# Patient Record
Sex: Female | Born: 1945 | Race: White | Hispanic: No | Smoking: Heavy tobacco smoker
Health system: Southern US, Community
[De-identification: ages and names within clinical notes are randomized; demographics above are authoritative.]

## PROBLEM LIST (undated history)

## (undated) DIAGNOSIS — J45909 Unspecified asthma, uncomplicated: Secondary | ICD-10-CM

## (undated) DIAGNOSIS — Z972 Presence of dental prosthetic device (complete) (partial): Secondary | ICD-10-CM

## (undated) DIAGNOSIS — I219 Acute myocardial infarction, unspecified: Secondary | ICD-10-CM

## (undated) DIAGNOSIS — I10 Essential (primary) hypertension: Secondary | ICD-10-CM

## (undated) DIAGNOSIS — M5020 Other cervical disc displacement, unspecified cervical region: Secondary | ICD-10-CM

## (undated) DIAGNOSIS — J449 Chronic obstructive pulmonary disease, unspecified: Secondary | ICD-10-CM

## (undated) DIAGNOSIS — M5126 Other intervertebral disc displacement, lumbar region: Secondary | ICD-10-CM

## (undated) DIAGNOSIS — Z973 Presence of spectacles and contact lenses: Secondary | ICD-10-CM

## (undated) HISTORY — PX: SQUAMOUS CELL CARCINOMA EXCISION: SHX2433

## (undated) HISTORY — PX: DILATION AND CURETTAGE OF UTERUS: SHX78

## (undated) HISTORY — PX: OOPHORECTOMY: SHX86

---

## 1977-06-11 HISTORY — PX: ABDOMINAL HYSTERECTOMY: SHX81

## 1999-04-19 ENCOUNTER — Other Ambulatory Visit: Admission: RE | Admit: 1999-04-19 | Discharge: 1999-04-19 | Payer: Self-pay | Admitting: Obstetrics and Gynecology

## 2000-08-02 ENCOUNTER — Other Ambulatory Visit: Admission: RE | Admit: 2000-08-02 | Discharge: 2000-08-02 | Payer: Self-pay | Admitting: Obstetrics and Gynecology

## 2000-10-03 ENCOUNTER — Ambulatory Visit (HOSPITAL_BASED_OUTPATIENT_CLINIC_OR_DEPARTMENT_OTHER): Admission: RE | Admit: 2000-10-03 | Discharge: 2000-10-03 | Payer: Self-pay | Admitting: Plastic Surgery

## 2001-08-12 ENCOUNTER — Other Ambulatory Visit: Admission: RE | Admit: 2001-08-12 | Discharge: 2001-08-12 | Payer: Self-pay | Admitting: Obstetrics and Gynecology

## 2002-10-15 ENCOUNTER — Other Ambulatory Visit: Admission: RE | Admit: 2002-10-15 | Discharge: 2002-10-15 | Payer: Self-pay | Admitting: Obstetrics and Gynecology

## 2004-01-27 ENCOUNTER — Other Ambulatory Visit: Admission: RE | Admit: 2004-01-27 | Discharge: 2004-01-27 | Payer: Self-pay | Admitting: Obstetrics and Gynecology

## 2005-06-19 ENCOUNTER — Other Ambulatory Visit: Admission: RE | Admit: 2005-06-19 | Discharge: 2005-06-19 | Payer: Self-pay | Admitting: Obstetrics and Gynecology

## 2005-07-15 ENCOUNTER — Emergency Department: Payer: Self-pay | Admitting: Emergency Medicine

## 2005-07-25 ENCOUNTER — Emergency Department: Payer: Self-pay | Admitting: Emergency Medicine

## 2012-01-30 ENCOUNTER — Emergency Department: Payer: Self-pay | Admitting: Emergency Medicine

## 2012-01-30 LAB — CBC
HCT: 44.5 % (ref 35.0–47.0)
HGB: 15 g/dL (ref 12.0–16.0)
MCH: 30.8 pg (ref 26.0–34.0)
MCHC: 33.8 g/dL (ref 32.0–36.0)
RDW: 13.4 % (ref 11.5–14.5)
WBC: 12.6 10*3/uL — ABNORMAL HIGH (ref 3.6–11.0)

## 2012-01-30 LAB — BASIC METABOLIC PANEL
Calcium, Total: 9.4 mg/dL (ref 8.5–10.1)
Chloride: 102 mmol/L (ref 98–107)
Co2: 29 mmol/L (ref 21–32)
EGFR (Non-African Amer.): >60
Glucose: 104 mg/dL — ABNORMAL HIGH (ref 65–99)
Osmolality: 275 (ref 275–301)
Potassium: 3.8 mmol/L (ref 3.5–5.1)
Sodium: 137 mmol/L (ref 136–145)

## 2012-01-30 LAB — CK TOTAL AND CKMB (NOT AT ARMC): CK-MB: 3 ng/mL (ref 0.5–3.6)

## 2015-06-27 ENCOUNTER — Encounter: Payer: Self-pay | Admitting: *Deleted

## 2015-06-28 NOTE — Discharge Instructions (Signed)

## 2015-07-01 ENCOUNTER — Encounter
Admission: RE | Disposition: A | Discharge status: Home / Self Care | Payer: Self-pay | Source: Ambulatory Visit | Attending: Unknown Physician Specialty

## 2015-07-01 ENCOUNTER — Ambulatory Visit: Payer: Medicare Other | Admitting: Anesthesiology

## 2015-07-01 ENCOUNTER — Ambulatory Visit
Admission: RE | Admit: 2015-07-01 | Discharge: 2015-07-01 | Disposition: A | Discharge status: Home / Self Care | Payer: Medicare Other | Source: Ambulatory Visit | Attending: Unknown Physician Specialty | Admitting: Unknown Physician Specialty

## 2015-07-01 DIAGNOSIS — Z7951 Long term (current) use of inhaled steroids: Secondary | ICD-10-CM | POA: Diagnosis not present

## 2015-07-01 DIAGNOSIS — J309 Allergic rhinitis, unspecified: Secondary | ICD-10-CM | POA: Insufficient documentation

## 2015-07-01 DIAGNOSIS — Z8541 Personal history of malignant neoplasm of cervix uteri: Secondary | ICD-10-CM | POA: Diagnosis not present

## 2015-07-01 DIAGNOSIS — Z881 Allergy status to other antibiotic agents status: Secondary | ICD-10-CM | POA: Insufficient documentation

## 2015-07-01 DIAGNOSIS — H6123 Impacted cerumen, bilateral: Secondary | ICD-10-CM | POA: Insufficient documentation

## 2015-07-01 DIAGNOSIS — Z8543 Personal history of malignant neoplasm of ovary: Secondary | ICD-10-CM | POA: Insufficient documentation

## 2015-07-01 DIAGNOSIS — M1991 Primary osteoarthritis, unspecified site: Secondary | ICD-10-CM | POA: Diagnosis not present

## 2015-07-01 DIAGNOSIS — F17200 Nicotine dependence, unspecified, uncomplicated: Secondary | ICD-10-CM | POA: Diagnosis not present

## 2015-07-01 DIAGNOSIS — J45909 Unspecified asthma, uncomplicated: Secondary | ICD-10-CM | POA: Insufficient documentation

## 2015-07-01 DIAGNOSIS — Z90710 Acquired absence of both cervix and uterus: Secondary | ICD-10-CM | POA: Diagnosis not present

## 2015-07-01 DIAGNOSIS — Z79899 Other long term (current) drug therapy: Secondary | ICD-10-CM | POA: Diagnosis not present

## 2015-07-01 DIAGNOSIS — H7193 Unspecified cholesteatoma, bilateral: Secondary | ICD-10-CM | POA: Diagnosis present

## 2015-07-01 HISTORY — DX: Other intervertebral disc displacement, lumbar region: M51.26

## 2015-07-01 HISTORY — DX: Essential (primary) hypertension: I10

## 2015-07-01 HISTORY — DX: Other cervical disc displacement, unspecified cervical region: M50.20

## 2015-07-01 HISTORY — DX: Acute myocardial infarction, unspecified: I21.9

## 2015-07-01 HISTORY — DX: Chronic obstructive pulmonary disease, unspecified: J44.9

## 2015-07-01 HISTORY — DX: Presence of spectacles and contact lenses: Z97.3

## 2015-07-01 HISTORY — DX: Presence of dental prosthetic device (complete) (partial): Z97.2

## 2015-07-01 HISTORY — DX: Unspecified asthma, uncomplicated: J45.909

## 2015-07-01 SURGERY — EXAM UNDER ANESTHESIA
Anesthesia: General | Laterality: Bilateral | Wound class: Clean Contaminated

## 2015-07-01 MED ORDER — LACTATED RINGERS IV SOLN
INTRAVENOUS | Status: DC
  Administered 2015-07-01: 11:00:00 via INTRAVENOUS

## 2015-07-01 MED ORDER — ACETAMINOPHEN 325 MG PO TABS
650.0000 mg | ORAL_TABLET | Freq: Once | ORAL | Status: AC
  Administered 2015-07-01: 650 mg via ORAL

## 2015-07-01 MED ORDER — ONDANSETRON HCL 4 MG/2ML IJ SOLN
INTRAMUSCULAR | Status: DC | PRN
  Administered 2015-07-01: 4 mg via INTRAVENOUS

## 2015-07-01 MED ORDER — PROPOFOL 10 MG/ML IV BOLUS
INTRAVENOUS | Status: DC | PRN
  Administered 2015-07-01: 50 mg via INTRAVENOUS

## 2015-07-01 MED ORDER — OFLOXACIN 0.3 % OP SOLN
OPHTHALMIC | Status: DC | PRN
  Administered 2015-07-01: 4 [drp] via OTIC

## 2015-07-01 MED ORDER — LIDOCAINE HCL (CARDIAC) 20 MG/ML IV SOLN
INTRAVENOUS | Status: DC | PRN
  Administered 2015-07-01: 50 mg via INTRAVENOUS

## 2015-07-01 MED ORDER — GLYCOPYRROLATE 0.2 MG/ML IJ SOLN
INTRAMUSCULAR | Status: DC | PRN
  Administered 2015-07-01: 0.1 mg via INTRAVENOUS

## 2015-07-01 MED ORDER — ACETAMINOPHEN 10 MG/ML IV SOLN: 1000.0000 mg | Freq: Once | INTRAVENOUS | Status: DC

## 2015-07-01 MED ORDER — FENTANYL CITRATE (PF) 100 MCG/2ML IJ SOLN: 25.0000 ug | INTRAMUSCULAR | Status: DC | PRN

## 2015-07-01 MED ORDER — ONDANSETRON HCL 4 MG/2ML IJ SOLN: 4.0000 mg | Freq: Once | INTRAMUSCULAR | Status: DC | PRN

## 2015-07-01 SURGICAL SUPPLY — 10 items
BALL CTTN LRG ABS STRL LF (GAUZE/BANDAGES/DRESSINGS) ×1
BLADE MYR LANCE NRW W/HDL (BLADE) IMPLANT
CANISTER SUCT 1200ML W/VALVE (MISCELLANEOUS) ×2 IMPLANT
COTTONBALL LRG STERILE PKG (GAUZE/BANDAGES/DRESSINGS) ×2 IMPLANT
GLOVE BIO SURGEON STRL SZ7.5 (GLOVE) ×5 IMPLANT
KIT ROOM TURNOVER OR (KITS) ×1 IMPLANT
STRAP BODY AND KNEE 60X3 (MISCELLANEOUS) ×3 IMPLANT
TOWEL OR 17X26 4PK STRL BLUE (TOWEL DISPOSABLE) ×3 IMPLANT
TUBING CONN 6MMX3.1M (TUBING) ×2
TUBING SUCTION CONN 0.25 STRL (TUBING) IMPLANT

## 2015-07-01 NOTE — Anesthesia Preprocedure Evaluation (Signed)
Anesthesia Evaluation  Patient identified by MRN, date of birth, ID band  Reviewed: Allergy & Precautions, H&P , NPO status , Patient's Chart, lab work & pertinent test results  Airway Mallampati: II  TM Distance: >3 FB Neck ROM: full    Dental  (+) Upper Dentures   Pulmonary asthma , COPD,  COPD inhaler, Current Smoker,    Pulmonary exam normal        Cardiovascular hypertension,  Rhythm:regular Rate:Normal     Neuro/Psych    GI/Hepatic   Endo/Other    Renal/GU      Musculoskeletal   Abdominal   Peds  Hematology   Anesthesia Other Findings   Reproductive/Obstetrics                             Anesthesia Physical Anesthesia Plan  ASA: III  Anesthesia Plan: General   Post-op Pain Management:    Induction:   Airway Management Planned:   Additional Equipment:   Intra-op Plan:   Post-operative Plan:   Informed Consent: I have reviewed the patients History and Physical, chart, labs and discussed the procedure including the risks, benefits and alternatives for the proposed anesthesia with the patient or authorized representative who has indicated his/her understanding and acceptance.     Plan Discussed with: CRNA  Anesthesia Plan Comments:         Anesthesia Quick Evaluation

## 2015-07-01 NOTE — Op Note (Signed)
07/01/2015  1:12 PM    [name], Gabriela Dice  [id number]   Pre-Op Dx: CERUMEN OTIITS EXTERMA  Post-op Dx: SAME  Proc: Exam under anesthesia with removal of cerumen and cholesteatoma right and left ear   Surg:  Linus Salmons [name]  Anes:  GOT  EBL:  Less than 5 cc  Comp:  None  Findings:  Large anterior perforation on the right with cholesteatoma filling the anterior middle ear space small central perforation posteriorly on the left with the cholesteatoma and wax debris  Procedure: Gabriela [name] was identified in the holding area taken the operating room placed in supine position. After IV sedation on the operating microscope was brought into the field beginning on the right-hand side the ear was examined. A large anterior piece of cerumen was removed need this was a large cholesteatoma ball filling the middle ear space anteriorly this was removed in its entirety in one piece. Examination the eardrum showed a large anterior perforation from whence the cholesteatoma came. Floxin drops were instilled in the actual canal followed by cotton ball. The left ear was examined there was a small posterior central perforation which was also filled with cerumen and debris this was gently teased free however the remaining some debris within the middle ear space. I did not feel comfortable probing this area the middle ear space for fear of endangering the ossicles. Floxin drops were then used on the left-hand side followed by cotton ball was return anesthesia where she was awakened in the operating room and taken recovery room in stable condition.  Cultures: None Specimens: Right ear cholesteatoma   Dispo:   Good  Plan:  Follow-up 3 weeks for hearing test and possible CT scan  Gabriela [name]  07/01/2015 1:12 PM

## 2015-07-01 NOTE — Anesthesia Procedure Notes (Addendum)
Performed by: Andee Poles Pre-anesthesia Checklist: Patient identified, Emergency Drugs available, Suction available, Timeout performed and Patient being monitored Patient Re-evaluated:Patient Re-evaluated prior to inductionOxygen Delivery Method: Circle system utilized Preoxygenation: Pre-oxygenation with 100% oxygen Intubation Type: IV induction Ventilation: Mask ventilation without difficulty and Mask ventilation throughout procedure Dental Injury: Teeth and Oropharynx as per pre-operative assessment

## 2015-07-01 NOTE — Transfer of Care (Signed)
Immediate Anesthesia Transfer of Care Note  Patient: Gabriela [name]  Procedure(s) Performed: Procedure(s): EXAM UNDER ANESTHESIA WITH CERUMEN REMOVAL (Bilateral)  Patient Location: PACU  Anesthesia Type: General  Level of Consciousness: awake, alert  and patient cooperative  Airway and Oxygen Therapy: Patient Spontanous Breathing and Patient connected to supplemental oxygen  Post-op Assessment: Post-op Vital signs reviewed, Patient's Cardiovascular Status Stable, Respiratory Function Stable, Patent Airway and No signs of Nausea or vomiting  Post-op Vital Signs: Reviewed and stable  Complications: No apparent anesthesia complications

## 2015-07-01 NOTE — H&P (Signed)
  H+P  Reviewed and will be scanned in later. No changes noted. 

## 2015-07-01 NOTE — Anesthesia Postprocedure Evaluation (Signed)
Anesthesia Post Note  Patient: Gabriela [name]  Procedure(s) Performed: Procedure(s) (LRB): EXAM UNDER ANESTHESIA WITH CERUMEN REMOVAL LEFT EAR AND REMOVAL OF CHOLESTEATOMA RIGHT EAR (Bilateral)  Patient location during evaluation: PACU Anesthesia Type: General Level of consciousness: awake and alert and oriented Pain management: satisfactory to patient Vital Signs Assessment: post-procedure vital signs reviewed and stable Respiratory status: spontaneous breathing, nonlabored ventilation and respiratory function stable Cardiovascular status: blood pressure returned to baseline and stable Postop Assessment: Adequate PO intake and No signs of nausea or vomiting Anesthetic complications: no    Cherly [place]

## 2015-07-05 LAB — SURGICAL PATHOLOGY

## 2015-07-21 ENCOUNTER — Other Ambulatory Visit: Payer: Self-pay | Admitting: Unknown Physician Specialty

## 2015-07-21 DIAGNOSIS — H7192 Unspecified cholesteatoma, left ear: Secondary | ICD-10-CM

## 2015-07-28 ENCOUNTER — Ambulatory Visit
Admission: RE | Admit: 2015-07-28 | Discharge: 2015-07-28 | Disposition: A | Discharge status: Home / Self Care | Payer: Medicare Other | Source: Ambulatory Visit | Attending: Unknown Physician Specialty | Admitting: Unknown Physician Specialty

## 2015-07-28 DIAGNOSIS — H7192 Unspecified cholesteatoma, left ear: Secondary | ICD-10-CM | POA: Diagnosis present

## 2016-03-09 ENCOUNTER — Emergency Department: Payer: Medicare Other

## 2016-03-09 ENCOUNTER — Encounter: Payer: Self-pay | Admitting: Medical Oncology

## 2016-03-09 ENCOUNTER — Other Ambulatory Visit: Payer: Self-pay

## 2016-03-09 ENCOUNTER — Emergency Department
Admission: EM | Admit: 2016-03-09 | Discharge: 2016-03-09 | Disposition: A | Discharge status: Home / Self Care | Payer: Medicare Other | Attending: Emergency Medicine | Admitting: Emergency Medicine

## 2016-03-09 DIAGNOSIS — I159 Secondary hypertension, unspecified: Secondary | ICD-10-CM | POA: Insufficient documentation

## 2016-03-09 DIAGNOSIS — I10 Essential (primary) hypertension: Secondary | ICD-10-CM | POA: Diagnosis not present

## 2016-03-09 DIAGNOSIS — I252 Old myocardial infarction: Secondary | ICD-10-CM | POA: Diagnosis not present

## 2016-03-09 DIAGNOSIS — F17210 Nicotine dependence, cigarettes, uncomplicated: Secondary | ICD-10-CM | POA: Insufficient documentation

## 2016-03-09 DIAGNOSIS — J45909 Unspecified asthma, uncomplicated: Secondary | ICD-10-CM | POA: Diagnosis not present

## 2016-03-09 DIAGNOSIS — J449 Chronic obstructive pulmonary disease, unspecified: Secondary | ICD-10-CM | POA: Diagnosis not present

## 2016-03-09 DIAGNOSIS — R42 Dizziness and giddiness: Secondary | ICD-10-CM | POA: Diagnosis present

## 2016-03-09 LAB — COMPREHENSIVE METABOLIC PANEL
ALK PHOS: 70 U/L (ref 38–126)
ALT: 12 U/L — AB (ref 14–54)
AST: 20 U/L (ref 15–41)
Albumin: 4.3 g/dL (ref 3.5–5.0)
Anion gap: 9 (ref 5–15)
BUN: 16 mg/dL (ref 6–20)
CALCIUM: 9.2 mg/dL (ref 8.9–10.3)
CO2: 27 mmol/L (ref 22–32)
CREATININE: 0.81 mg/dL (ref 0.44–1.00)
Chloride: 98 mmol/L — ABNORMAL LOW (ref 101–111)
Glucose, Bld: 107 mg/dL — ABNORMAL HIGH (ref 65–99)
Potassium: 4.3 mmol/L (ref 3.5–5.1)
Sodium: 134 mmol/L — ABNORMAL LOW (ref 135–145)
Total Bilirubin: 0.7 mg/dL (ref 0.3–1.2)
Total Protein: 7.3 g/dL (ref 6.5–8.1)

## 2016-03-09 LAB — CBC WITH DIFFERENTIAL/PLATELET
Basophils Absolute: 0.1 10*3/uL (ref 0–0.1)
Basophils Relative: 1 %
Eosinophils Absolute: 0 10*3/uL (ref 0–0.7)
Eosinophils Relative: 0 %
HEMATOCRIT: 42.6 % (ref 35.0–47.0)
HEMOGLOBIN: 14.3 g/dL (ref 12.0–16.0)
LYMPHS ABS: 2.2 10*3/uL (ref 1.0–3.6)
LYMPHS PCT: 15 %
MCH: 30.3 pg (ref 26.0–34.0)
MCHC: 33.7 g/dL (ref 32.0–36.0)
MCV: 89.9 fL (ref 80.0–100.0)
Monocytes Absolute: 0.8 10*3/uL (ref 0.2–0.9)
Monocytes Relative: 6 %
NEUTROS ABS: 11.2 10*3/uL — AB (ref 1.4–6.5)
NEUTROS PCT: 78 %
Platelets: 289 10*3/uL (ref 150–440)
RBC: 4.74 MIL/uL (ref 3.80–5.20)
RDW: 13.3 % (ref 11.5–14.5)
WBC: 14.4 10*3/uL — AB (ref 3.6–11.0)

## 2016-03-09 LAB — TSH: TSH: 1.37 u[IU]/mL (ref 0.350–4.500)

## 2016-03-09 LAB — TROPONIN I

## 2016-03-09 MED ORDER — CLONIDINE HCL 0.1 MG PO TABS
0.1000 mg | ORAL_TABLET | Freq: Once | ORAL | Status: AC
  Administered 2016-03-09: 0.1 mg via ORAL
  Filled 2016-03-09: qty 1

## 2016-03-09 MED ORDER — CLONIDINE HCL 0.1 MG PO TABS: 0.1000 mg | ORAL_TABLET | Freq: Two times a day (BID) | ORAL | 0 refills | Status: DC | PRN

## 2016-03-09 MED ORDER — LORAZEPAM 2 MG/ML IJ SOLN
0.5000 mg | Freq: Once | INTRAMUSCULAR | Status: AC
  Administered 2016-03-09: 0.5 mg via INTRAVENOUS
  Filled 2016-03-09: qty 1

## 2016-03-09 MED ORDER — LORAZEPAM 1 MG PO TABS: 1.0000 mg | ORAL_TABLET | Freq: Two times a day (BID) | ORAL | 0 refills | Status: AC | PRN

## 2016-03-09 NOTE — ED Provider Notes (Signed)
Time Seen: Approximately 1140  I have reviewed the triage notes  Chief Complaint: Weakness; Headache; and Dizziness   History of Present Illness: Gabriela [name] is a 70 y.o. female *who presents with diffuse symptoms of a generalized weakness and occasional feelings of lightheadedness. Patient's been to see her primary physician was recently diagnosed with sinusitis. She went to the primary doctor today and was noted to have tachycardia and was referred to the emergency department for further evaluation. Patient states that she does feel anxious. She states no trouble with speech or swallowing but she has had a right-sided headache that she states initially started in the frontal area and then migrated to the right side of her neck and states it feels like a tension headache which he's had before in the past. Some blurred vision earlier in the week and some "" bright lights this morning in her right eye but her vision is normal at this point and denies any visual field deficits. Patient denies any chest or abdominal pain and freely admits that she's been having some panic attacks recently and does feel anxious at this point. Recently had an adjustment on her medication and she was taken plain losartan and was established on Losartan 50/HCTZ.   Past Medical History:  Diagnosis Date  . Asthma   . COPD (chronic obstructive pulmonary disease) (HCC)   . Herniated cervical disc    no limitations  . Hypertension   . Lumbar herniated disc    no limitations  . Myocardial infarction [facility])    pt reports MD says past MI shows on EKG. no cardiologist  . Wears contact lenses   . Wears dentures    full upper    There are no active problems to display for this patient.   Past Surgical History:  Procedure Laterality Date  . ABDOMINAL HYSTERECTOMY  1979  . DILATION AND CURETTAGE OF UTERUS    . OOPHORECTOMY  [id number]  . SQUAMOUS CELL CARCINOMA EXCISION     eyelid    Past Surgical History:   Procedure Laterality Date  . ABDOMINAL HYSTERECTOMY  1979  . DILATION AND CURETTAGE OF UTERUS    . OOPHORECTOMY  [id number]  . SQUAMOUS CELL CARCINOMA EXCISION     eyelid    Current Outpatient Rx  . Order #: [id number] Class: Historical Med  . Order #: [id number] Class: Historical Med  . Order #: [id number] Class: Historical Med  . Order #: [id number] Class: Historical Med    Allergies:  Tetracyclines & related  Family History: No family history on file.  Social History: Social History  Substance Use Topics  . Smoking status: Heavy Tobacco Smoker    Packs/day: 1.50    Years: 46.00    Types: Cigarettes  . Smokeless tobacco: Not on file  . Alcohol use No     Review of Systems:   10 point review of systems was performed and was otherwise negative:  Constitutional: No fever Eyes:Visual disturbances described above. Currently normal vision for the patient ENT: No sore throat, ear pain Cardiac: No chest pain Respiratory: No shortness of breath, wheezing, or stridor Abdomen: No abdominal pain, no vomiting, No diarrhea Endocrine: No weight loss, No night sweats Extremities: No peripheral edema, cyanosis Skin: No rashes, easy bruising Neurologic: No focal weakness, trouble with speech or swollowing Urologic: No dysuria, Hematuria, or urinary frequency   Physical Exam:  ED Triage Vitals  Enc Vitals Group     BP 03/09/16 1049 (!) 159/114     Pulse  Rate 03/09/16 1049 (!) 121     Resp 03/09/16 1049 20     Temp 03/09/16 1049 97.9 F (36.6 C)     Temp Source 03/09/16 1049 Oral     SpO2 03/09/16 1049 99 %     Weight 03/09/16 1050 136 lb (61.7 kg)     Height 03/09/16 1050 5\' 4"  (1.626 m)     Head Circumference --      Peak Flow --      Pain Score 03/09/16 1050 4     Pain Loc --      Pain Edu? --      Excl. in GC? --     General: Awake , Alert , and Oriented times 3; GCS 15 Anxious, somewhat scattered historian Head: Normal cephalic , atraumatic Eyes: Pupils equal ,  round, reactive to light Nose/Throat: No nasal drainage, patent upper airway without erythema or exudate.  Neck: Supple, Full range of motion, No anterior adenopathy or palpable thyroid masses Lungs: Clear to ascultation without wheezes , rhonchi, or rales Heart: Tachycardia with a regular rhythm without murmurs gallops or rubs Abdomen: Soft, non tender without rebound, guarding , or rigidity; bowel sounds positive and symmetric in all 4 quadrants. No organomegaly .        Extremities: 2 plus symmetric pulses. No edema, clubbing or cyanosis Neurologic: normal ambulation, Motor symmetric without deficits, sensory intact Skin: warm, dry, no rashes   Labs:   All laboratory work was reviewed including any pertinent negatives or positives listed below:  Labs Reviewed  CBC WITH DIFFERENTIAL/PLATELET  COMPREHENSIVE METABOLIC PANEL  TSH  TROPONIN I    EKG:  ED ECG REPORT I, Jennye Moccasin, the attending physician, personally viewed and interpreted this ECG.  Date: 03/09/2016 EKG Time: 1055 Rate: 118 Rhythm: Sinus tachycardia QRS Axis: Left axis deviation Intervals: normal ST/T Wave abnormalities: normal Conduction Disturbances: none Narrative Interpretation: unremarkable Left ventricular hypertrophy No acute ischemic changes noted   Radiology:  "Ct Head Wo Contrast  Result Date: 03/09/2016 CLINICAL DATA:  Headaches, dizziness. EXAM: CT HEAD WITHOUT CONTRAST TECHNIQUE: Contiguous axial images were obtained from the base of the skull through the vertex without intravenous contrast. COMPARISON:  CT scan of July 28, 2015. FINDINGS: Brain: No mass effect or midline shift is noted. Ventricular size is within normal limits. There is no evidence of mass lesion, hemorrhage or acute infarction. Vascular: No abnormality seen. Skull: Postoperative changes are seen involving the left mastoid region. Otherwise bony calvarium is unremarkable. Sinuses/Orbits: Visualized sinuses appear normal.  Other: None. IMPRESSION: Postoperative changes are seen in the left mastoid region. No acute intracranial abnormality seen. Electronically Signed   By: Lupita Raider, M.D.   On: 03/09/2016 15:02  "  I personally reviewed the radiologic studies       ED Course:  Patient was placed on a continuous cardiac monitor and was given Ativan for what seemed to be some anxiety and also clonidine for hypertension. This combination appeared to work well for the patient as her heart rate slowed down into the low 80s and her blood pressure came down to normal levels. She doesn't appear to have any focal weakness indicative of a transient ischemic attack or a cerebrovascular accident. Her visual disturbances did not sound to be of an amuroses fugax f. Patient appears to have a hypertensive urgency and clinically at the time of her departure from the emergency department was back to her baseline. Clinical Course     Assessment:  Anxiety Hypertensive urgency     Plan: * Outpatient " Discharge Medication List as of 03/09/2016  3:32 PM    " Clonidine when necessary Ativan  Patient was advised to return immediately if condition worsens. Patient was advised to follow up with their primary care physician or other specialized physicians involved in their outpatient care. The patient and/or family member/power of attorney had laboratory results reviewed at the bedside. All questions and concerns were addressed and appropriate discharge instructions were distributed by the nursing staff.               Jennye MoccasinBrian S Necole Minassian, MD 03/09/16 [phone]

## 2016-03-09 NOTE — Discharge Instructions (Signed)
Please return immediately if condition worsens. Please contact her primary physician or the physician you were given for referral. If you have any specialist physicians involved in her treatment and plan please also contact them. Thank you for using [facility]. ° °

## 2016-03-09 NOTE — ED Notes (Signed)
Pt transported to CT ?

## 2016-03-09 NOTE — ED Triage Notes (Signed)
Pt reports she has been having headaches off and on since Tuesday but was seen by here pcp and told she had sinus infection. Since then pt has been having dizzy spells and times when she feels like both of her legs are weak and she has been having trouble seeing out of her rt eye for a few days. Went to PCP again this am and there were EKG changes noted so pt was referred to ED.

## 2016-05-17 ENCOUNTER — Ambulatory Visit: Payer: Medicare Other | Admitting: Pulmonary Disease

## 2019-01-06 ENCOUNTER — Other Ambulatory Visit: Payer: Self-pay | Admitting: Internal Medicine

## 2019-01-06 DIAGNOSIS — Z1231 Encounter for screening mammogram for malignant neoplasm of breast: Secondary | ICD-10-CM

## 2019-02-12 ENCOUNTER — Ambulatory Visit
Admission: RE | Admit: 2019-02-12 | Discharge: 2019-02-12 | Disposition: A | Discharge status: Home / Self Care | Payer: Medicare Other | Source: Ambulatory Visit | Attending: Internal Medicine | Admitting: Internal Medicine

## 2019-02-12 DIAGNOSIS — Z1231 Encounter for screening mammogram for malignant neoplasm of breast: Secondary | ICD-10-CM | POA: Diagnosis present

## 2019-06-18 ENCOUNTER — Other Ambulatory Visit: Payer: Medicare Other

## 2019-06-18 ENCOUNTER — Ambulatory Visit: Payer: Medicare Other | Attending: Internal Medicine

## 2019-06-18 DIAGNOSIS — Z20822 Contact with and (suspected) exposure to covid-19: Secondary | ICD-10-CM

## 2019-06-20 LAB — NOVEL CORONAVIRUS, NAA: SARS-CoV-2, NAA: NOT DETECTED

## 2019-10-17 ENCOUNTER — Ambulatory Visit: Payer: Medicare Other | Attending: Internal Medicine

## 2019-10-17 DIAGNOSIS — Z23 Encounter for immunization: Secondary | ICD-10-CM

## 2019-10-17 NOTE — Progress Notes (Signed)
   Covid-19 Vaccination Clinic  Name:  AALIYHA MUMFORD    MRN: [id number] DOB: [birth year]  10/17/2019  Ms. Nauman was observed post Covid-19 immunization for 15 minutes without incident. She was provided with Vaccine Information Sheet and instruction to access the V-Safe system.   Ms. Gayman was instructed to call 911 with any severe reactions post vaccine: Marland Kitchen Difficulty breathing  . Swelling of face and throat  . A fast heartbeat  . A bad rash all over body  . Dizziness and weakness   Immunizations Administered    Name Date Dose VIS Date Route   Pfizer COVID-19 Vaccine 10/17/2019 11:41 AM 0.3 mL 08/05/2018 Intramuscular   Manufacturer: [employer], [employer]   Lot: [id number]   NDC: [address]-0

## 2019-11-10 ENCOUNTER — Ambulatory Visit: Payer: Medicare Other | Attending: Internal Medicine

## 2019-11-10 DIAGNOSIS — Z23 Encounter for immunization: Secondary | ICD-10-CM

## 2019-11-10 NOTE — Progress Notes (Signed)
   Covid-19 Vaccination Clinic  Name:  LESIELI BRESEE    MRN: [id number] DOB: December 27, 1945  11/10/2019  Ms. Labrador was observed post Covid-19 immunization for 15 minutes without incident. She was provided with Vaccine Information Sheet and instruction to access the V-Safe system.   Ms. Matson was instructed to call 911 with any severe reactions post vaccine: Marland Kitchen Difficulty breathing  . Swelling of face and throat  . A fast heartbeat  . A bad rash all over body  . Dizziness and weakness   Immunizations Administered    Name Date Dose VIS Date Route   Pfizer COVID-19 Vaccine 11/10/2019  3:10 PM 0.3 mL 08/05/2018 Intramuscular   Manufacturer: [employer], [employer]   Lot: [id number]   NDC: [address]-1

## 2020-01-21 ENCOUNTER — Other Ambulatory Visit: Payer: Self-pay | Admitting: Internal Medicine

## 2020-01-21 DIAGNOSIS — Z1231 Encounter for screening mammogram for malignant neoplasm of breast: Secondary | ICD-10-CM

## 2020-05-19 ENCOUNTER — Other Ambulatory Visit: Payer: Self-pay

## 2020-05-19 ENCOUNTER — Ambulatory Visit
Admission: RE | Admit: 2020-05-19 | Discharge: 2020-05-19 | Disposition: A | Discharge status: Home / Self Care | Payer: Medicare Other | Source: Ambulatory Visit | Attending: Internal Medicine | Admitting: Internal Medicine

## 2020-05-19 DIAGNOSIS — Z1231 Encounter for screening mammogram for malignant neoplasm of breast: Secondary | ICD-10-CM | POA: Diagnosis present

## 2021-01-25 ENCOUNTER — Other Ambulatory Visit: Payer: Self-pay | Admitting: Internal Medicine

## 2021-01-25 DIAGNOSIS — Z1231 Encounter for screening mammogram for malignant neoplasm of breast: Secondary | ICD-10-CM

## 2021-05-10 ENCOUNTER — Other Ambulatory Visit: Payer: Self-pay | Admitting: Specialist

## 2021-05-10 DIAGNOSIS — J45901 Unspecified asthma with (acute) exacerbation: Secondary | ICD-10-CM

## 2021-05-10 DIAGNOSIS — R059 Cough, unspecified: Secondary | ICD-10-CM

## 2021-05-10 DIAGNOSIS — J441 Chronic obstructive pulmonary disease with (acute) exacerbation: Secondary | ICD-10-CM

## 2021-05-10 DIAGNOSIS — R0602 Shortness of breath: Secondary | ICD-10-CM

## 2021-05-22 ENCOUNTER — Other Ambulatory Visit: Payer: Self-pay

## 2021-05-22 ENCOUNTER — Ambulatory Visit
Admission: RE | Admit: 2021-05-22 | Discharge: 2021-05-22 | Disposition: A | Discharge status: Home / Self Care | Payer: Medicare Other | Source: Ambulatory Visit | Attending: Internal Medicine | Admitting: Internal Medicine

## 2021-05-22 DIAGNOSIS — Z1231 Encounter for screening mammogram for malignant neoplasm of breast: Secondary | ICD-10-CM | POA: Insufficient documentation

## 2021-05-30 ENCOUNTER — Other Ambulatory Visit: Payer: Self-pay

## 2021-05-30 ENCOUNTER — Ambulatory Visit
Admission: RE | Admit: 2021-05-30 | Discharge: 2021-05-30 | Disposition: A | Discharge status: Home / Self Care | Payer: Medicare Other | Source: Ambulatory Visit | Attending: Specialist | Admitting: Specialist

## 2021-05-30 DIAGNOSIS — J45901 Unspecified asthma with (acute) exacerbation: Secondary | ICD-10-CM | POA: Diagnosis present

## 2021-05-30 DIAGNOSIS — J441 Chronic obstructive pulmonary disease with (acute) exacerbation: Secondary | ICD-10-CM | POA: Insufficient documentation

## 2021-05-30 DIAGNOSIS — R0602 Shortness of breath: Secondary | ICD-10-CM | POA: Diagnosis not present

## 2021-05-30 DIAGNOSIS — R059 Cough, unspecified: Secondary | ICD-10-CM | POA: Insufficient documentation

## 2021-06-08 ENCOUNTER — Other Ambulatory Visit: Payer: Self-pay | Admitting: Specialist

## 2021-06-08 DIAGNOSIS — R911 Solitary pulmonary nodule: Secondary | ICD-10-CM

## 2021-06-28 ENCOUNTER — Other Ambulatory Visit: Payer: Self-pay

## 2021-06-28 ENCOUNTER — Ambulatory Visit
Admission: RE | Admit: 2021-06-28 | Discharge: 2021-06-28 | Disposition: A | Discharge status: Home / Self Care | Payer: Medicare Other | Source: Ambulatory Visit | Attending: Specialist | Admitting: Specialist

## 2021-06-28 DIAGNOSIS — R911 Solitary pulmonary nodule: Secondary | ICD-10-CM | POA: Insufficient documentation

## 2021-06-28 DIAGNOSIS — E041 Nontoxic single thyroid nodule: Secondary | ICD-10-CM | POA: Diagnosis not present

## 2021-06-28 LAB — GLUCOSE, CAPILLARY: Glucose-Capillary: 102 mg/dL — ABNORMAL HIGH (ref 70–99)

## 2021-06-28 MED ORDER — FLUDEOXYGLUCOSE F - 18 (FDG) INJECTION
7.0000 | Freq: Once | INTRAVENOUS | Status: AC | PRN
  Administered 2021-06-28: 7.73 via INTRAVENOUS

## 2021-06-30 ENCOUNTER — Other Ambulatory Visit: Payer: Self-pay | Admitting: Specialist

## 2021-06-30 DIAGNOSIS — E041 Nontoxic single thyroid nodule: Secondary | ICD-10-CM

## 2021-06-30 DIAGNOSIS — R911 Solitary pulmonary nodule: Secondary | ICD-10-CM

## 2021-11-09 HISTORY — DX: Hypocalcemia: E83.51

## 2021-12-01 HISTORY — PX: OTHER SURGICAL HISTORY: SHX169

## 2022-01-08 ENCOUNTER — Other Ambulatory Visit: Payer: Self-pay

## 2022-01-08 ENCOUNTER — Emergency Department
Admission: EM | Admit: 2022-01-08 | Discharge: 2022-01-08 | Disposition: A | Discharge status: Home / Self Care | Payer: Medicare Other | Attending: Emergency Medicine | Admitting: Emergency Medicine

## 2022-01-08 DIAGNOSIS — R197 Diarrhea, unspecified: Secondary | ICD-10-CM

## 2022-01-08 DIAGNOSIS — R799 Abnormal finding of blood chemistry, unspecified: Secondary | ICD-10-CM | POA: Diagnosis present

## 2022-01-08 LAB — CBC WITH DIFFERENTIAL/PLATELET
Abs Immature Granulocytes: 0.02 10*3/uL (ref 0.00–0.07)
Basophils Absolute: 0.1 10*3/uL (ref 0.0–0.1)
Basophils Relative: 0 %
Eosinophils Absolute: 0.2 10*3/uL (ref 0.0–0.5)
Eosinophils Relative: 2 %
HCT: 40.1 % (ref 36.0–46.0)
Hemoglobin: 12.9 g/dL (ref 12.0–15.0)
Immature Granulocytes: 0 %
Lymphocytes Relative: 27 %
Lymphs Abs: 3.1 10*3/uL (ref 0.7–4.0)
MCH: 29.5 pg (ref 26.0–34.0)
MCHC: 32.2 g/dL (ref 30.0–36.0)
MCV: 91.6 fL (ref 80.0–100.0)
Monocytes Absolute: 0.9 10*3/uL (ref 0.1–1.0)
Monocytes Relative: 8 %
Neutro Abs: 7.4 10*3/uL (ref 1.7–7.7)
Neutrophils Relative %: 63 %
Platelets: 281 10*3/uL (ref 150–400)
RBC: 4.38 MIL/uL (ref 3.87–5.11)
RDW: 12.9 % (ref 11.5–15.5)
WBC: 11.7 10*3/uL — ABNORMAL HIGH (ref 4.0–10.5)
nRBC: 0 % (ref 0.0–0.2)

## 2022-01-08 LAB — COMPREHENSIVE METABOLIC PANEL
ALT: 11 U/L (ref 0–44)
AST: 19 U/L (ref 15–41)
Albumin: 4.2 g/dL (ref 3.5–5.0)
Alkaline Phosphatase: 75 U/L (ref 38–126)
Anion gap: 12 (ref 5–15)
BUN: 22 mg/dL (ref 8–23)
CO2: 26 mmol/L (ref 22–32)
Calcium: 6.3 mg/dL — CL (ref 8.9–10.3)
Chloride: 98 mmol/L (ref 98–111)
Creatinine, Ser: 0.87 mg/dL (ref 0.44–1.00)
GFR, Estimated: >60 mL/min (ref >60)
Glucose, Bld: 105 mg/dL — ABNORMAL HIGH (ref 70–99)
Potassium: 4.2 mmol/L (ref 3.5–5.1)
Sodium: 136 mmol/L (ref 135–145)
Total Bilirubin: 0.7 mg/dL (ref 0.3–1.2)
Total Protein: 6.7 g/dL (ref 6.5–8.1)

## 2022-01-08 LAB — TSH: TSH: 2.555 u[IU]/mL (ref 0.350–4.500)

## 2022-01-08 MED ORDER — CALCIUM GLUCONATE-NACL 2-0.675 GM/100ML-% IV SOLN
2.0000 g | Freq: Once | INTRAVENOUS | Status: AC
  Administered 2022-01-08: 2000 mg via INTRAVENOUS
  Filled 2022-01-08: qty 100

## 2022-01-08 MED ORDER — CALCITRIOL 0.25 MCG PO CAPS
0.5000 ug | ORAL_CAPSULE | Freq: Once | ORAL | Status: AC
  Administered 2022-01-08: 0.5 ug via ORAL
  Filled 2022-01-08: qty 2

## 2022-01-08 MED ORDER — CALCITRIOL 0.25 MCG PO CAPS: 0.2500 ug | ORAL_CAPSULE | Freq: Every day | ORAL | 0 refills | Status: DC

## 2022-01-08 MED ORDER — VITAMIN D (ERGOCALCIFEROL) 1.25 MG (50000 UNIT) PO CAPS: 50000.0000 [IU] | ORAL_CAPSULE | ORAL | 0 refills | Status: AC

## 2022-01-08 NOTE — ED Triage Notes (Signed)
Pt has been seen for a follow up appointment for a thyroidectomy and calcium was low. Pt has had diarrhea, tingling, and leg cramps with dizziness and blurred vision at times. Blurred vision not present at this time.

## 2022-01-08 NOTE — ED Provider Triage Note (Signed)
  Emergency Medicine Provider Triage Evaluation Note  Gabriela [name] , a 76 y.o.female,  was evaluated in triage.  Pt complains of hypocalcemia.  She is getting a checkup today after having a thyroidectomy approximately 6 weeks ago when she had an abnormal lab.  She is advised to come to the emergency department due to her low calcium.  She states that she has had some muscle cramps and feeling fatigued, otherwise has not had any significant symptoms.   Review of Systems  Positive: Muscle cramps, fatigue Negative: Denies fever, chest pain, vomiting  Physical Exam   Vitals:   01/08/22 1707  BP: (!) 173/76  Pulse: 76  Resp: 14  Temp: 98.3 F (36.8 C)  SpO2: 92%   Gen:   Awake, no distress   Resp:  Normal effort  MSK:   Moves extremities without difficulty  Other:    Medical Decision Making  Given the patient's initial medical screening exam, the following diagnostic evaluation has been ordered. The patient will be placed in the appropriate treatment space, once one is available, to complete the evaluation and treatment. I have discussed the plan of care with the patient and I have advised the patient that an ED physician or mid-level practitioner will reevaluate their condition after the test results have been received, as the results may give them additional insight into the type of treatment they may need.    Diagnostics: Labs, EKG.  Treatments: none immediately   Varney Daily, [place] 01/08/22 1709

## 2022-01-12 ENCOUNTER — Observation Stay
Admission: EM | Admit: 2022-01-12 | Discharge: 2022-01-14 | Disposition: A | Discharge status: Home / Self Care | Payer: Medicare Other | Attending: Internal Medicine | Admitting: Internal Medicine

## 2022-01-12 ENCOUNTER — Other Ambulatory Visit: Payer: Self-pay

## 2022-01-12 DIAGNOSIS — Z79899 Other long term (current) drug therapy: Secondary | ICD-10-CM | POA: Insufficient documentation

## 2022-01-12 DIAGNOSIS — Z8711 Personal history of peptic ulcer disease: Secondary | ICD-10-CM

## 2022-01-12 DIAGNOSIS — F17210 Nicotine dependence, cigarettes, uncomplicated: Secondary | ICD-10-CM | POA: Diagnosis not present

## 2022-01-12 DIAGNOSIS — Z85828 Personal history of other malignant neoplasm of skin: Secondary | ICD-10-CM | POA: Insufficient documentation

## 2022-01-12 DIAGNOSIS — E039 Hypothyroidism, unspecified: Secondary | ICD-10-CM | POA: Insufficient documentation

## 2022-01-12 DIAGNOSIS — I2510 Atherosclerotic heart disease of native coronary artery without angina pectoris: Secondary | ICD-10-CM | POA: Diagnosis not present

## 2022-01-12 DIAGNOSIS — I10 Essential (primary) hypertension: Secondary | ICD-10-CM

## 2022-01-12 DIAGNOSIS — E890 Postprocedural hypothyroidism: Secondary | ICD-10-CM

## 2022-01-12 DIAGNOSIS — J449 Chronic obstructive pulmonary disease, unspecified: Secondary | ICD-10-CM

## 2022-01-12 DIAGNOSIS — J45909 Unspecified asthma, uncomplicated: Secondary | ICD-10-CM | POA: Diagnosis not present

## 2022-01-12 DIAGNOSIS — Z85850 Personal history of malignant neoplasm of thyroid: Secondary | ICD-10-CM

## 2022-01-12 LAB — CBC WITH DIFFERENTIAL/PLATELET
Abs Immature Granulocytes: 0.03 10*3/uL (ref 0.00–0.07)
Basophils Absolute: 0.1 10*3/uL (ref 0.0–0.1)
Basophils Relative: 1 %
Eosinophils Absolute: 0.2 10*3/uL (ref 0.0–0.5)
Eosinophils Relative: 2 %
HCT: 38.8 % (ref 36.0–46.0)
Hemoglobin: 12.6 g/dL (ref 12.0–15.0)
Immature Granulocytes: 0 %
Lymphocytes Relative: 23 %
Lymphs Abs: 2.8 10*3/uL (ref 0.7–4.0)
MCH: 29.6 pg (ref 26.0–34.0)
MCHC: 32.5 g/dL (ref 30.0–36.0)
MCV: 91.1 fL (ref 80.0–100.0)
Monocytes Absolute: 1 10*3/uL (ref 0.1–1.0)
Monocytes Relative: 8 %
Neutro Abs: 7.9 10*3/uL — ABNORMAL HIGH (ref 1.7–7.7)
Neutrophils Relative %: 66 %
Platelets: 274 10*3/uL (ref 150–400)
RBC: 4.26 MIL/uL (ref 3.87–5.11)
RDW: 13 % (ref 11.5–15.5)
WBC: 12 10*3/uL — ABNORMAL HIGH (ref 4.0–10.5)
nRBC: 0 % (ref 0.0–0.2)

## 2022-01-12 LAB — HEPATIC FUNCTION PANEL
ALT: 9 U/L (ref 0–44)
AST: 19 U/L (ref 15–41)
Albumin: 4 g/dL (ref 3.5–5.0)
Alkaline Phosphatase: 71 U/L (ref 38–126)
Bilirubin, Direct: 0.2 mg/dL (ref 0.0–0.2)
Indirect Bilirubin: 0.4 mg/dL (ref 0.3–0.9)
Total Bilirubin: 0.6 mg/dL (ref 0.3–1.2)
Total Protein: 6.6 g/dL (ref 6.5–8.1)

## 2022-01-12 LAB — BASIC METABOLIC PANEL
Anion gap: 9 (ref 5–15)
BUN: 20 mg/dL (ref 8–23)
CO2: 27 mmol/L (ref 22–32)
Calcium: 6.4 mg/dL — CL (ref 8.9–10.3)
Chloride: 100 mmol/L (ref 98–111)
Creatinine, Ser: 0.81 mg/dL (ref 0.44–1.00)
GFR, Estimated: >60 mL/min (ref >60)
Glucose, Bld: 112 mg/dL — ABNORMAL HIGH (ref 70–99)
Potassium: 4.3 mmol/L (ref 3.5–5.1)
Sodium: 136 mmol/L (ref 135–145)

## 2022-01-12 LAB — MAGNESIUM: Magnesium: 2.2 mg/dL (ref 1.7–2.4)

## 2022-01-12 LAB — TSH: TSH: 2.235 u[IU]/mL (ref 0.350–4.500)

## 2022-01-12 LAB — T4, FREE: Free T4: 1.31 ng/dL — ABNORMAL HIGH (ref 0.61–1.12)

## 2022-01-12 LAB — PHOSPHORUS: Phosphorus: 5.2 mg/dL — ABNORMAL HIGH (ref 2.5–4.6)

## 2022-01-12 MED ORDER — ONDANSETRON HCL 4 MG/2ML IJ SOLN: 4.0000 mg | Freq: Four times a day (QID) | INTRAMUSCULAR | Status: DC | PRN

## 2022-01-12 MED ORDER — CALCIUM CARBONATE ANTACID 500 MG PO CHEW
800.0000 mg | CHEWABLE_TABLET | Freq: Once | ORAL | Status: AC
  Administered 2022-01-12: 800 mg via ORAL
  Filled 2022-01-12: qty 4

## 2022-01-12 MED ORDER — ONDANSETRON HCL 4 MG PO TABS: 4.0000 mg | ORAL_TABLET | Freq: Four times a day (QID) | ORAL | Status: DC | PRN

## 2022-01-12 MED ORDER — SODIUM CHLORIDE 0.9 % IV SOLN: INTRAVENOUS | Status: DC | PRN

## 2022-01-12 MED ORDER — LOSARTAN POTASSIUM 50 MG PO TABS
100.0000 mg | ORAL_TABLET | Freq: Every day | ORAL | Status: DC
  Administered 2022-01-12 – 2022-01-14 (×3): 100 mg via ORAL
  Filled 2022-01-12 (×3): qty 2

## 2022-01-12 MED ORDER — FUROSEMIDE 20 MG PO TABS
20.0000 mg | ORAL_TABLET | Freq: Every day | ORAL | Status: DC
  Administered 2022-01-13 – 2022-01-14 (×2): 20 mg via ORAL
  Filled 2022-01-12 (×2): qty 1

## 2022-01-12 MED ORDER — TIOTROPIUM BROMIDE MONOHYDRATE 18 MCG IN CAPS
18.0000 ug | ORAL_CAPSULE | Freq: Every day | RESPIRATORY_TRACT | Status: DC
Start: 2022-01-13 — End: 2022-01-14
  Administered 2022-01-13 – 2022-01-14 (×2): 18 ug via RESPIRATORY_TRACT
  Filled 2022-01-12: qty 5

## 2022-01-12 MED ORDER — ALBUTEROL SULFATE (5 MG/ML) 0.5% IN NEBU: 2.5000 mg | INHALATION_SOLUTION | Freq: Four times a day (QID) | RESPIRATORY_TRACT | Status: DC | PRN

## 2022-01-12 MED ORDER — LORAZEPAM 1 MG PO TABS
1.0000 mg | ORAL_TABLET | Freq: Two times a day (BID) | ORAL | Status: DC | PRN
Start: 2022-01-12 — End: 2022-01-14

## 2022-01-12 MED ORDER — HYDRALAZINE HCL 50 MG PO TABS
50.0000 mg | ORAL_TABLET | Freq: Two times a day (BID) | ORAL | Status: DC
Start: 2022-01-12 — End: 2022-01-14
  Administered 2022-01-12 – 2022-01-14 (×4): 50 mg via ORAL
  Filled 2022-01-12 (×4): qty 1

## 2022-01-12 MED ORDER — ALBUTEROL SULFATE (2.5 MG/3ML) 0.083% IN NEBU: 2.5000 mg | INHALATION_SOLUTION | Freq: Four times a day (QID) | RESPIRATORY_TRACT | Status: DC | PRN

## 2022-01-12 MED ORDER — LEVOTHYROXINE SODIUM 88 MCG PO TABS
88.0000 ug | ORAL_TABLET | Freq: Every day | ORAL | Status: DC
  Administered 2022-01-13 – 2022-01-14 (×2): 88 ug via ORAL
  Filled 2022-01-12 (×2): qty 1

## 2022-01-12 MED ORDER — PANTOPRAZOLE SODIUM 40 MG PO TBEC
40.0000 mg | DELAYED_RELEASE_TABLET | Freq: Every day | ORAL | Status: DC
  Administered 2022-01-13 – 2022-01-14 (×2): 40 mg via ORAL
  Filled 2022-01-12 (×2): qty 1

## 2022-01-12 MED ORDER — ACETAMINOPHEN 325 MG PO TABS: 650.0000 mg | ORAL_TABLET | Freq: Four times a day (QID) | ORAL | Status: DC | PRN

## 2022-01-12 MED ORDER — ACETAMINOPHEN 650 MG RE SUPP: 650.0000 mg | Freq: Four times a day (QID) | RECTAL | Status: DC | PRN

## 2022-01-12 MED ORDER — CALCIUM GLUCONATE-NACL 1-0.675 GM/50ML-% IV SOLN
1.0000 g | Freq: Once | INTRAVENOUS | Status: AC
  Administered 2022-01-12: 1000 mg via INTRAVENOUS
  Filled 2022-01-12: qty 50

## 2022-01-12 MED ORDER — ENOXAPARIN SODIUM 40 MG/0.4ML IJ SOSY
40.0000 mg | PREFILLED_SYRINGE | INTRAMUSCULAR | Status: DC
  Filled 2022-01-12: qty 0.4

## 2022-01-12 MED ORDER — BISOPROLOL FUMARATE 5 MG PO TABS
5.0000 mg | ORAL_TABLET | Freq: Every day | ORAL | Status: DC
  Administered 2022-01-13 – 2022-01-14 (×2): 5 mg via ORAL
  Filled 2022-01-12 (×3): qty 1

## 2022-01-12 MED ORDER — CALCIUM GLUCONATE-NACL 1-0.675 GM/50ML-% IV SOLN
1.0000 g | Freq: Once | INTRAVENOUS | Status: DC
Start: 2022-01-12 — End: 2022-01-12

## 2022-01-12 MED ORDER — CALCITRIOL 0.25 MCG PO CAPS
0.2500 ug | ORAL_CAPSULE | Freq: Two times a day (BID) | ORAL | Status: DC
  Administered 2022-01-13 – 2022-01-14 (×3): 0.25 ug via ORAL
  Filled 2022-01-12 (×4): qty 1

## 2022-01-12 MED ORDER — CALCIUM GLUCONATE-NACL 1-0.675 GM/50ML-% IV SOLN
1.0000 g | Freq: Once | INTRAVENOUS | Status: AC
  Administered 2022-01-12: 1000 mg via INTRAVENOUS
  Filled 2022-01-12 (×2): qty 50

## 2022-01-12 MED ORDER — CALCITRIOL 0.25 MCG PO CAPS
0.5000 ug | ORAL_CAPSULE | ORAL | Status: AC
  Administered 2022-01-12: 0.5 ug via ORAL
  Filled 2022-01-12: qty 2

## 2022-01-12 MED ORDER — CALCIUM CARBONATE ANTACID 500 MG PO CHEW
5.0000 | CHEWABLE_TABLET | Freq: Three times a day (TID) | ORAL | Status: DC
Start: 2022-01-13 — End: 2022-01-14
  Administered 2022-01-13 – 2022-01-14 (×4): 1000 mg via ORAL
  Filled 2022-01-12 (×4): qty 5

## 2022-01-12 NOTE — Assessment & Plan Note (Addendum)
Per recommendations of on-call endocrinologist at [facility], Dr. Everette Rank: -Calcium gluconate 2 g IV every hour x2 doses(completed from the ED) -Calcium level every 3 hours - Calcitriol 0.5 mcg now (completed in the ED) -Resume calcitriol 0.25 mg twice daily on 8/5 - Resume Tums(was previously on Tums for 30 days postop): calcium carbonate (TUMS ULTRA) 400 mg calcium (1,000 mg) chewable tablet Take 3 tablets (3,000 mg of salt total) by mouth 3 (three) times daily with meals (received first dose in the ED)

## 2022-01-12 NOTE — ED Triage Notes (Signed)
Pt to ED for hypocalcemia, drawn today at PCP and pt was called and told to go to ER because calcium was 6.7.. Had thyroidectomy 12/01/21, recently seen here for same complaint of hypocalcemia. At this time denies tingling or muscle cramping/spasms. Took BP, no carpal spasms noted.

## 2022-01-12 NOTE — ED Provider Triage Note (Signed)
Emergency Medicine Provider Triage Evaluation Note  Gabriela [name] , a 76 y.o. female  was evaluated in triage.  Pt complains of low calcium.  Patient had thyroidectomy and her specialist at [facility] told her to come to the ED as her calcium was very low..  Review of Systems  Positive: Low calcium Negative:   Physical Exam  Ht 5\' 4"  (1.626 m)   Wt 59.9 kg   BMI 22.66 kg/m  Gen:   Awake, no distress   Resp:  Normal effort  MSK:   Moves extremities without difficulty  Other:    Medical Decision Making  Medically screening exam initiated at 4:18 PM.  Appropriate orders placed.  Gabriela [name] was informed that the remainder of the evaluation will be completed by another provider, this initial triage assessment does not replace that evaluation, and the importance of remaining in the ED until their evaluation is complete.  Labs from [facility] show a normal PTH on 7/31, it was 5 on 623, calcium with critical value of 6.3 on 01/08/2022.  States had calcium drawn again today and was told to return the emergency department   01/10/2022, PA-C 01/12/22 1619

## 2022-01-12 NOTE — ED Provider Notes (Signed)
[facility] Provider Note    Event Date/Time   First MD Initiated Contact with Patient 01/12/22 1702     (approximate)   History   abnormal labs   HPI  Gabriela [name] is a 76 y.o. 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 recently seen in this ED on 7/31 for evaluation of low calcium reportedly restarted on her calcitriol which had been discontinued who presents for evaluation after was told her calcium levels are low and need to come to emergency room.  She states she does not currently have any numbness or tingling and has a little bit of bloating constipation but no other sick symptoms.  No chest pain, cough, shortness of breath, dizziness, headedness or any numbness.  She states she is compliant with her levothyroxine as well as her calcitriol.  She states he has not been taking her Tums per directions from her surgeon.    Past Medical History:  Diagnosis Date   Asthma    COPD (chronic obstructive pulmonary disease) (HCC)    Herniated cervical disc    no limitations   Hypertension    Hypocalcemia 11/2021   since 12/01/21 after thyroid removed   Lumbar herniated disc    no limitations   Myocardial infarction [facility])    pt reports MD says past MI shows on EKG. no cardiologist   Wears contact lenses    Wears dentures    full upper     Physical Exam  Triage Vital Signs: ED Triage Vitals  Enc Vitals Group     BP 01/12/22 1622 134/64     Pulse Rate 01/12/22 1622 76     Resp 01/12/22 1622 16     Temp 01/12/22 1622 98.2 F (36.8 C)     Temp Source 01/12/22 1622 Oral     SpO2 01/12/22 1622 97 %     Weight 01/12/22 1618 132 lb (59.9 kg)     Height 01/12/22 1618 5\' 4"  (1.626 m)     Head Circumference --      Peak Flow --      Pain Score  01/12/22 1618 0     Pain Loc --      Pain Edu? --      Excl. in GC? --     Most recent vital signs: Vitals:   01/12/22 1622 01/12/22 1846  BP: 134/64 116/82  Pulse: 76 72  Resp: 16 19  Temp: 98.2 F (36.8 C)   SpO2: 97% 96%    General: Awake, no distress.  CV:  Good peripheral perfusion.  Resp:  Normal effort.  Abd:  No distention.  Soft throughout. Other:  Sensation is intact to light touch in bilateral upper extremities   ED Results / Procedures / Treatments  Labs (all labs ordered are listed, but only abnormal results are displayed) Labs Reviewed  BASIC METABOLIC PANEL - Abnormal; Notable for the following components:      Result Value   Glucose, Bld 112 (*)    Calcium 6.4 (*)    All other components within normal limits  CBC WITH DIFFERENTIAL/PLATELET - Abnormal; Notable for the following components:   WBC 12.0 (*)    Neutro Abs 7.9 (*)    All other components within normal limits  PHOSPHORUS - Abnormal;  Notable for the following components:   Phosphorus 5.2 (*)    All other components within normal limits  T4, FREE - Abnormal; Notable for the following components:   Free T4 1.31 (*)    All other components within normal limits  MAGNESIUM  HEPATIC FUNCTION PANEL  TSH  PARATHYROID HORMONE, INTACT (NO CA)  CALCIUM, IONIZED  CALCIUM  CALCIUM  CALCIUM  CALCIUM  CALCIUM     EKG  EKG is marked for sinus rhythm with left anterior fascicle block, ventricular rate of 70, otherwise unremarkable intervals without clear evidence of acute ischemia.   RADIOLOGY   PROCEDURES:  Critical Care performed: Yes, see critical care procedure note(s)  .Critical Care  Performed by: Gilles Chiquito, MD Authorized by: Gilles Chiquito, MD   Critical care provider statement:    Critical care time (minutes):  30   Critical care was necessary to treat or prevent imminent or life-threatening deterioration of the following conditions:  Endocrine crisis   Critical care  was time spent personally by me on the following activities:  Development of treatment plan with patient or surrogate, discussions with consultants, evaluation of patient's response to treatment, examination of patient, ordering and review of laboratory studies, ordering and review of radiographic studies, ordering and performing treatments and interventions, pulse oximetry, re-evaluation of patient's condition and review of old charts    MEDICATIONS ORDERED IN ED: Medications  calcium gluconate 1 g/ 50 mL sodium chloride IVPB (has no administration in time range)  calcium gluconate 1 g/ 50 mL sodium chloride IVPB (0 mg Intravenous Stopped 01/12/22 1946)  calcium carbonate (TUMS - dosed in mg elemental calcium) chewable tablet 800 mg of elemental calcium (800 mg of elemental calcium Oral Given 01/12/22 2000)  calcitRIOL (ROCALTROL) capsule 0.5 mcg (0.5 mcg Oral Given 01/12/22 2026)     IMPRESSION / MDM / ASSESSMENT AND PLAN / ED COURSE  I reviewed the triage vital signs and the nursing notes. Patient's presentation is most consistent with acute presentation with potential threat to life or bodily function.                               Differential diagnosis includes, but is not limited to hypocalcemia related to recent thyroidectomy with possible inadvertent partial parathyroidectomy as well.  I also considered other electrolyte derangements, kidney injury.  Her history and exam is not suggestive of primary respiratory disorder causing her hypocalcemia  BMP is remarkable for calcium of 6.4.  No other significant lactate or metabolic derangements on BMP.  CBC without acute anemia and WC count of 12 with normal platelets.  Magnesium WNL.  Phosphorus 5.2.  Hepatic function panel shows normal albumin.  TSH WNL.  Free T41.3.  Discussed with on-call endocrinologist at [facility] Dr Everette Rank recommends overnight admission for IV repletion.  She recommends 2 g of calcium gluconate separated by 1 hour and  checking calcium every 3 hours.  In addition she recommends getting patient 0.5 mcg of calcitriol now and tomorrow restarting on 0.25 twice daily.  She also recommends reinitiating oral calcium and I will give 800 mg tonight.  I discussed this with many hospitalist will place admission orders.      FINAL CLINICAL IMPRESSION(S) / ED DIAGNOSES   Final diagnoses:  Hypocalcemia     Rx / DC Orders   ED Discharge Orders     None        Note:  This  document was prepared using Conservation officer, historic buildings and may include unintentional dictation errors.   Gilles Chiquito, MD 01/12/22 2027

## 2022-01-12 NOTE — Assessment & Plan Note (Addendum)
Not acutely exacerbated Albuterol as needed Continue Spiriva

## 2022-01-12 NOTE — ED Notes (Addendum)
Critical Result: Calcium 6.4  Darl Pikes, [place] aware.

## 2022-01-12 NOTE — H&P (Signed)
History and Physical    Patient: Gabriela [name] VOZ:[id number] DOB: [birth year] DOA: 01/12/2022 DOS: the patient was seen and examined on 01/12/2022 PCP: Marguarite Arbour, MD  Patient coming from: Home  Chief Complaint:  Chief Complaint  Patient presents with   abnormal labs    HPI: Gabriela [name] is a 76 y.o. 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  She is states that her symptoms of dizziness, tingling and diarrhea have improved and now she has a little bit of constipation.  She is compliant with her levothyroxine and calcitriol but not so much with her Tums. ED course and data review: Vitals within normal limits. Labs: Calcium 6.4 (ionized calcium pending), phosphorus 5.2.  Potassium and magnesium WNL.  TSH normal at 2.235 and free T4 slightly elevated at 1.31.  WBC 12,000 and CBC otherwise. EKG, personally viewed and interpreted: NSR at 70 with nonspecific ST-T wave changes  The ED provider: Spoke with on-call endocrinologist at [facility], Dr. Everette Rank who recommended the following: 2 g of calcium gluconate separated by 1 hour and checking calcium every 3 hours.  In addition she recommends getting patient 0.5 mcg of calcitriol now and tomorrow restarting on 0.25 twice daily.  She also recommends reinitiating oral calcium and I will give 800 mg tonight   Hospitalist consulted for admission    Past Medical History:  Diagnosis Date   Asthma    COPD (chronic obstructive pulmonary disease)  (HCC)    Herniated cervical disc    no limitations   Hypertension    Hypocalcemia 11/2021   since 12/01/21 after thyroid removed   Lumbar herniated disc    no limitations   Myocardial infarction [facility])    pt reports MD says past MI shows on EKG. no cardiologist   Wears contact lenses    Wears dentures    full upper   Past Surgical History:  Procedure Laterality Date   ABDOMINAL HYSTERECTOMY  06/11/1977   DILATION AND CURETTAGE OF UTERUS     OOPHORECTOMY  [id number]   SQUAMOUS CELL CARCINOMA EXCISION     eyelid   thyroidectomy  12/01/2021   Social History:  reports that she has been smoking cigarettes. She has a 69.00 pack-year smoking history. She does not have any smokeless tobacco history on file. She reports that she does not drink alcohol. No history on file for drug use.  Allergies  Allergen Reactions   Tetracyclines & Related Rash    Family History  Problem Relation Age of Onset   Breast cancer Sister 41   Breast cancer Sister 46    Prior to Admission medications   Medication Sig Start Date End Date Taking? Authorizing Provider  albuterol (PROVENTIL HFA;VENTOLIN HFA) 108 (90 Base) MCG/ACT inhaler Inhale into the lungs every 6 (six) hours as needed for wheezing or shortness of breath.    [provider]  albuterol (PROVENTIL) (5 MG/ML) 0.5% nebulizer solution Take 2.5 mg by nebulization every 6 (six) hours as needed for wheezing or shortness of breath.    [provider]  amoxicillin-clavulanate (AUGMENTIN) 875-125 MG tablet Take 1 tablet by mouth 2 (two) times daily. 03/06/16   [provider]  calcitRIOL (ROCALTROL) 0.25 MCG capsule Take 1 capsule (0.25 mcg total) by mouth daily. 01/08/22 02/07/22  Merwyn Katos, MD  cloNIDine (CATAPRES) 0.1 MG tablet Take 1 tablet (0.1 mg total) by mouth 2 (two) times daily as needed. 03/09/16 03/09/17  Jennye Moccasin, MD  lisinopril-hydrochlorothiazide (PRINZIDE,ZESTORETIC) 20-12.5 MG tablet Take 1 tablet by  mouth every morning. 03/06/16   [provider]  LORazepam (ATIVAN) 1 MG tablet Take 1 tablet (1 mg total) by mouth 2 (two) times daily as needed for anxiety. 03/09/16   Jennye Moccasin, MD  Vitamin D, Ergocalciferol, (DRISDOL) 1.25 MG (50000 UNIT) CAPS capsule Take 1 capsule (50,000 Units total) by mouth every 7 (seven) days. 01/08/22   Merwyn Katos, MD    Physical Exam: Vitals:   01/12/22 1618 01/12/22 1622 01/12/22 1846  BP:  134/64 116/82  Pulse:  76 72  Resp:  16 19  Temp:  98.2 F (36.8 C)   TempSrc:  Oral   SpO2:  97% 96%  Weight: 59.9 kg    Height: 5\' 4"  (1.626 m)     Physical Exam Vitals and nursing note reviewed.  Constitutional:      General: She is not in acute distress. HENT:     Head: Normocephalic and atraumatic.  Cardiovascular:     Rate and Rhythm: Normal rate and regular rhythm.     Heart sounds: Normal heart sounds.  Pulmonary:     Effort: Pulmonary effort is normal.     Breath sounds: Normal breath sounds.  Abdominal:     Palpations: Abdomen is soft.     Tenderness: There is no abdominal tenderness.  Neurological:     Mental Status: Mental status is at baseline.     Labs on Admission: I have personally reviewed following labs and imaging studies  CBC: Recent Labs  Lab 01/08/22 1728 01/12/22 1625  WBC 11.7* 12.0*  NEUTROABS 7.4 7.9*  HGB 12.9 12.6  HCT 40.1 38.8  MCV 91.6 91.1  PLT 281 274   Basic Metabolic Panel: Recent Labs  Lab 01/08/22 1728 01/12/22 1625  NA 136 136  K 4.2 4.3  CL 98 100  CO2 26 27  GLUCOSE 105* 112*  BUN 22 20  CREATININE 0.87 0.81  CALCIUM 6.3* 6.4*  MG  --  2.2  PHOS  --  5.2*   GFR: Estimated Creatinine Clearance: 51 mL/min (by C-G formula based on SCr of 0.81 mg/dL). Liver Function Tests: Recent Labs  Lab 01/08/22 1728 01/12/22 1625  AST 19 19  ALT 11 9  ALKPHOS 75 71  BILITOT 0.7 0.6  PROT 6.7 6.6  ALBUMIN 4.2 4.0   No results for input(s): "LIPASE", "AMYLASE" in the last 168  hours. No results for input(s): "AMMONIA" in the last 168 hours. Coagulation Profile: No results for input(s): "INR", "PROTIME" in the last 168 hours. Cardiac Enzymes: No results for input(s): "CKTOTAL", "CKMB", "CKMBINDEX", "TROPONINI" in the last 168 hours. BNP (last 3 results) No results for input(s): "PROBNP" in the last 8760 hours. HbA1C: No results for input(s): "HGBA1C" in the last 72 hours. CBG: No results for input(s): "GLUCAP" in the last 168 hours. Lipid Profile: No results for input(s): "CHOL", "HDL", "LDLCALC", "TRIG", "CHOLHDL", "LDLDIRECT" in the last 72 hours. Thyroid Function Tests: Recent Labs    01/12/22 1625  TSH 2.235  FREET4 1.31*  Anemia Panel: No results for input(s): "VITAMINB12", "FOLATE", "FERRITIN", "TIBC", "IRON", "RETICCTPCT" in the last 72 hours. Urine analysis: No results found for: "COLORURINE", "APPEARANCEUR", "LABSPEC", "PHURINE", "GLUCOSEU", "HGBUR", "BILIRUBINUR", "KETONESUR", "PROTEINUR", "UROBILINOGEN", "NITRITE", "LEUKOCYTESUR"  Radiological Exams on Admission: No results found.   Data Reviewed: Relevant notes from primary care and specialist visits, past discharge summaries as available in EHR, including Care Everywhere. Prior diagnostic testing as pertinent to current admission diagnoses Updated medications and problem lists for reconciliation ED course, including vitals, labs, imaging, treatment and response to treatment Triage notes, nursing and pharmacy notes and ED provider's notes Notable results as noted in HPI   Assessment and Plan: * Hypocalcemia s/p total thyroidectomy 12/01/21 Per recommendations of on-call endocrinologist at [facility], Dr. Bud Face: -Calcium gluconate 2 g IV every hour x2 doses(completed from the ED) -Calcium level every 3 hours - Calcitriol 0.5 mcg now (completed in the ED) -Resume calcitriol 0.25 mg twice daily on 8/5 - Resume Tums(was previously on Tums for 30 days postop): calcium carbonate (TUMS ULTRA)  400 mg calcium (1,000 mg) chewable tablet Take 3 tablets (3,000 mg of salt total) by mouth 3 (three) times daily with meals (received first dose in the ED)    Postsurgical hypothyroidism History of papillary thyroid carcinoma s/p total thyroidectomy on 12/01/2021 Continue Synthroid at current dose TSH normal, T4 slightly elevated  History of peptic ulcer disease Continue PPI  Hypertension Continue losartan 100 daily, bisoprolol 5 mg daily and hydralazine 50 mg twice daily   COPD (chronic obstructive pulmonary disease) ([place]) Not acutely exacerbated Albuterol as needed Continue Spiriva      DVT prophylaxis: Lovenox  Consults: none  Advance Care Planning:   Code Status: Not on file full code  Family Communication: none  Disposition Plan: Back to previous home environment  Severity of Illness: The appropriate patient status for this patient is OBSERVATION. Observation status is judged to be reasonable and necessary in order to provide the required intensity of service to ensure the patient's safety. The patient's presenting symptoms, physical exam findings, and initial radiographic and laboratory data in the context of their medical condition is felt to place them at decreased risk for further clinical deterioration. Furthermore, it is anticipated that the patient will be medically stable for discharge from the hospital within 2 midnights of admission.   Author: Athena Masse, MD 01/12/2022 8:33 PM  For on call review [email or web address].

## 2022-01-12 NOTE — ED Notes (Signed)
Pt ambulated to and from bathroom with independent and steady gait. Positioned back in bed for comfort. Bed low and locked with side rails raised x2. Call bell in reach and cardiac monitor in place. Family at bedside. Breathing noted to by unlabored, speaking in full sentences with symmetric chest rise and fall.

## 2022-01-12 NOTE — Assessment & Plan Note (Addendum)
History of papillary thyroid carcinoma s/p total thyroidectomy on 12/01/2021 Continue Synthroid at current dose TSH normal, T4 slightly elevated

## 2022-01-12 NOTE — Assessment & Plan Note (Addendum)
Continue losartan 100 daily, bisoprolol 5 mg daily and hydralazine 50 mg twice daily

## 2022-01-12 NOTE — Assessment & Plan Note (Signed)
Continue PPI ?

## 2022-01-13 LAB — CALCIUM
Calcium: 6.8 mg/dL — ABNORMAL LOW (ref 8.9–10.3)
Calcium: 6.9 mg/dL — ABNORMAL LOW (ref 8.9–10.3)
Calcium: 7.3 mg/dL — ABNORMAL LOW (ref 8.9–10.3)
Calcium: 8.3 mg/dL — ABNORMAL LOW (ref 8.9–10.3)

## 2022-01-13 MED ORDER — CALCIUM GLUCONATE-NACL 1-0.675 GM/50ML-% IV SOLN
1.0000 g | Freq: Once | INTRAVENOUS | Status: AC
  Administered 2022-01-13: 1000 mg via INTRAVENOUS
  Filled 2022-01-13: qty 50

## 2022-01-13 MED ORDER — CALCIUM GLUCONATE-NACL 1-0.675 GM/50ML-% IV SOLN
1.0000 g | Freq: Once | INTRAVENOUS | Status: AC
Start: 2022-01-13 — End: 2022-01-13
  Administered 2022-01-13: 1000 mg via INTRAVENOUS
  Filled 2022-01-13: qty 50

## 2022-01-13 MED ORDER — HYDRALAZINE HCL 20 MG/ML IJ SOLN: 10.0000 mg | Freq: Four times a day (QID) | INTRAMUSCULAR | Status: DC | PRN

## 2022-01-13 NOTE — TOC CM/SW Note (Signed)
Patient and her sister asked if there were any outpatient clinics she could go to in order to get IV Calcium when needed instead of coming to the ED, spending a night or two at the hospital, etc. Sent secure chat to MD to ask.  Charlynn Court, CSW [phone]

## 2022-01-13 NOTE — Progress Notes (Signed)
PROGRESS NOTE    Gabriela [name]  GUR:[id number] DOB: 21-Dec-1945 DOA: 01/12/2022 PCP: Marguarite Arbour, MD    Brief Narrative:  Gabriela [name] is a 76 y.o. 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  She is states that her symptoms of dizziness, tingling and diarrhea have improved and now she has a little bit of constipation.  She is compliant with her levothyroxine and calcitriol but not so much with her Tums. ED course and data review: Vitals within normal limits. Labs: Calcium 6.4 (ionized calcium pending), phosphorus 5.2.  Potassium and magnesium WNL.  TSH normal at 2.235 and free T4 slightly elevated at 1.31.  WBC 12,000 and CBC otherwise. EKG, personally viewed and interpreted: NSR at 70 with nonspecific ST-T wave changes   The ED provider: Spoke with on-call endocrinologist at [facility], Dr. Everette Rank who recommended the following: 2 g of calcium gluconate separated by 1 hour and checking calcium every 3 hours.  In addition she recommends getting patient 0.5 mcg of calcitriol now and tomorrow restarting on 0.25 twice daily.  She also recommends reinitiating oral calcium and I will give 800 mg tonight     8/5 calcium 6.8.  Will give 1 g  Consultants:    Procedures:   Antimicrobials:      Subjective: Denies any tingling, numbness, no respiratory issues   Objective: Vitals:   01/12/22 2132 01/12/22 2327 01/13/22 0336 01/13/22 0826  BP: (!) 186/96 (!) 153/83 (!) 151/75 (!) 151/74   Pulse: 81 89 80 80  Resp: 20 20 20 17   Temp: 98.2 F (36.8 C) 97.9 F (36.6 C) 98.7 F (37.1 C) 97.8 F (36.6 C)  TempSrc: Oral Oral Oral Oral  SpO2: 92% 99% 95% 92%  Weight: 59.7 kg     Height: 5\' 4"  (1.626 m)       Intake/Output Summary (Last 24 hours) at 01/13/2022 0906 Last data filed at 01/13/2022 0300 Gross per 24 hour  Intake 109.75 ml  Output --  Net 109.75 ml   Filed Weights   01/12/22 1618 01/12/22 2132  Weight: 59.9 kg 59.7 kg    Examination: Calm, NAD Cta no w/r Reg s1/s2 no gallop Soft benign +bs No edema Aaoxox3  Mood and affect appropriate in current setting     Data Reviewed: I have personally reviewed following labs and imaging studies  CBC: Recent Labs  Lab 01/08/22 1728 01/12/22 1625  WBC 11.7* 12.0*  NEUTROABS 7.4 7.9*  HGB 12.9 12.6  HCT 40.1 38.8  MCV 91.6 91.1  PLT 281 274   Basic Metabolic Panel: Recent Labs  Lab 01/08/22 1728 01/12/22 1625 01/13/22 0032 01/13/22 0355 01/13/22 0706  NA 136 136  --   --   --   K 4.2 4.3  --   --   --   CL 98 100  --   --   --   CO2 26 27  --   --   --   GLUCOSE 105* 112*  --   --   --  BUN 22 20  --   --   --   CREATININE 0.87 0.81  --   --   --   CALCIUM 6.3* 6.4* 7.3* 6.8* 6.9*  MG  --  2.2  --   --   --   PHOS  --  5.2*  --   --   --    GFR: Estimated Creatinine Clearance: 51 mL/min (by C-G formula based on SCr of 0.81 mg/dL). Liver Function Tests: Recent Labs  Lab 01/08/22 1728 01/12/22 1625  AST 19 19  ALT 11 9  ALKPHOS 75 71  BILITOT 0.7 0.6  PROT 6.7 6.6  ALBUMIN 4.2 4.0   No results for input(s): "LIPASE", "AMYLASE" in the last 168 hours. No results for input(s): "AMMONIA" in the last 168 hours. Coagulation Profile: No results for input(s): "INR", "PROTIME" in the last 168 hours. Cardiac Enzymes: No results for input(s): "CKTOTAL", "CKMB", "CKMBINDEX", "TROPONINI" in the last 168 hours. BNP (last 3 results) No results for input(s): "PROBNP" in the last 8760  hours. HbA1C: No results for input(s): "HGBA1C" in the last 72 hours. CBG: No results for input(s): "GLUCAP" in the last 168 hours. Lipid Profile: No results for input(s): "CHOL", "HDL", "LDLCALC", "TRIG", "CHOLHDL", "LDLDIRECT" in the last 72 hours. Thyroid Function Tests: Recent Labs    01/12/22 1625  TSH 2.235  FREET4 1.31*   Anemia Panel: No results for input(s): "VITAMINB12", "FOLATE", "FERRITIN", "TIBC", "IRON", "RETICCTPCT" in the last 72 hours. Sepsis Labs: No results for input(s): "PROCALCITON", "LATICACIDVEN" in the last 168 hours.  No results found for this or any previous visit (from the past 240 hour(s)).       Radiology Studies: No results found.      Scheduled Meds:  bisoprolol  5 mg Oral Daily   calcitRIOL  0.25 mcg Oral BID   calcium carbonate  5 tablet Oral TID WC   enoxaparin (LOVENOX) injection  40 mg Subcutaneous Q24H   furosemide  20 mg Oral Daily   hydrALAZINE  50 mg Oral BID   levothyroxine  88 mcg Oral Q0600   losartan  100 mg Oral Daily   pantoprazole  40 mg Oral Daily   tiotropium  18 mcg Inhalation Daily   Continuous Infusions:  sodium chloride Stopped (01/13/22 0103)   calcium gluconate      Assessment & Plan:   Principal Problem:   Hypocalcemia s/p total thyroidectomy 12/01/21 Active Problems:   Postsurgical hypothyroidism   COPD (chronic obstructive pulmonary disease) (HCC)   Hx of papillary thyroid carcinoma s/p total thyroidectomy 12/01/2021   Hypertension   History of peptic ulcer disease   Hypocalcemia s/p total thyroidectomy 12/01/21 Per recommendations of on-call endocrinologist at [facility], Dr. Everette Rank: -Calcium gluconate 2 g IV every hour x2 doses(completed from the ED) -Calcium level every 3 hours - Calcitriol 0.5 mcg now (completed in the ED) -Resume calcitriol 0.25 mg twice daily on 8/5 - Resume Tums(was previously on Tums for 30 days postop): calcium carbonate (TUMS ULTRA) 400 mg calcium (1,000 mg) chewable tablet  Take 3 tablets (3,000 mg of salt total) by mouth 3 (three) times daily with meals (received first dose in the ED)  8/5 spoke to pharmacy, will give another 2g iv calcium today Ck ca level in am Will need to f/u with endocrine closely     Postsurgical hypothyroidism History of papillary thyroid carcinoma s/p total thyroidectomy on 12/01/2021 TSH normal, T4 slightly elevated 8/5 continue Synthroid   History of peptic ulcer disease  Continue PPI   Hypertension Continue losartan 100 daily, bisoprolol 5 mg daily and hydralazine 50 mg twice daily 8/5 continue with current regimen   COPD (chronic obstructive pulmonary disease) (HCC) Not acutely exacerbated Albuterol as needed Continue Spiriva         DVT prophylaxis: Lovenox Code Status: Full Family Communication: Sister at bedside Disposition Plan: Home Status is: Observation The patient remains OBS appropriate and will d/c before 2 midnights.        LOS: 0 days   Time spent: 35 minutes   Lynn Ito, MD [facility] Pager [phone]  If 7PM-7AM, please contact night-coverage 01/13/2022, 9:06 AM

## 2022-01-13 NOTE — Care Management Obs Status (Signed)
MEDICARE OBSERVATION STATUS NOTIFICATION   Patient Details  Name: Gabriela [name] MRN: [id number] Date of Birth: December 03, 1945   Medicare Observation Status Notification Given:  Yes    Margarito Liner, LCSW 01/13/2022, 4:27 PM

## 2022-01-14 LAB — BASIC METABOLIC PANEL
Anion gap: 8 (ref 5–15)
BUN: 18 mg/dL (ref 8–23)
CO2: 29 mmol/L (ref 22–32)
Calcium: 7.8 mg/dL — ABNORMAL LOW (ref 8.9–10.3)
Chloride: 99 mmol/L (ref 98–111)
Creatinine, Ser: 0.79 mg/dL (ref 0.44–1.00)
GFR, Estimated: >60 mL/min (ref >60)
Glucose, Bld: 96 mg/dL (ref 70–99)
Potassium: 4 mmol/L (ref 3.5–5.1)
Sodium: 136 mmol/L (ref 135–145)

## 2022-01-14 LAB — CALCIUM, IONIZED: Calcium, Ionized, Serum: 3.5 mg/dL — ABNORMAL LOW (ref 4.5–5.6)

## 2022-01-14 MED ORDER — CALCIUM GLUCONATE-NACL 1-0.675 GM/50ML-% IV SOLN
1.0000 g | Freq: Once | INTRAVENOUS | Status: AC
  Administered 2022-01-14: 1000 mg via INTRAVENOUS
  Filled 2022-01-14: qty 50

## 2022-01-14 MED ORDER — CALCITRIOL 0.25 MCG PO CAPS: 0.2500 ug | ORAL_CAPSULE | Freq: Two times a day (BID) | ORAL | 0 refills | Status: AC

## 2022-01-14 MED ORDER — CALCIUM CARBONATE ANTACID 500 MG PO CHEW: 5.0000 | CHEWABLE_TABLET | Freq: Three times a day (TID) | ORAL | Status: AC

## 2022-01-14 NOTE — Plan of Care (Signed)
Reviewed discharge instructions, addressed questions.

## 2022-01-14 NOTE — Discharge Summary (Signed)
Gabriela [name] RDE:[id number] DOB: Mar 02, 1946 DOA: 01/12/2022  PCP: Marguarite Arbour, MD  Admit date: 01/12/2022 Discharge date: 01/14/2022  Admitted From: Home Disposition: Home  Recommendations for Outpatient Follow-up:  Follow up with PCP next week Please obtain BMP/CBC in one week Please follow up primary  endocrinologist next week     Discharge Condition:Stable CODE STATUS: Full Diet recommendation: Heart Healthy  Brief/Interim Summary: Per Gabriela [name] is a 76 y.o. 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  She is states that her symptoms of dizziness, tingling and diarrhea have improved and now she has a little bit of constipation.  She is compliant with her levothyroxine and calcitriol but not so much with her Tums. ED course and data review: Vitals within normal limits. Labs: Calcium 6.4 (ionized calcium pending), phosphorus 5.2.  Potassium and magnesium WNL.  TSH normal at 2.235 and free T4 slightly elevated at 1.31.  WBC 12,000 and CBC otherwise. EKG, personally viewed and interpreted: NSR at 70 with nonspecific ST-T wave changes   The ED provider: Spoke with on-call endocrinologist at [facility], Dr. Everette Rank who recommended the following: 2 g of calcium gluconate separated by 1 hour and checking calcium every 3 hours.  In addition she recommends getting patient 0.5 mcg of calcitriol now and tomorrow restarting on 0.25 twice daily.  She also recommends reinitiating oral calcium and  I will give 800 mg tonight . Patient was admitted to the hospital.  Was given multiple IV calcium gluconate doses.  She remains asymptomatic today and stable for discharge.   Hypocalcemia s/p total thyroidectomy 12/01/21 Per recommendations of on-call endocrinologist at [facility], Dr. Everette Rank: - Calcitriol 0.5 mcg now (completed in the ED) -Resume calcitriol 0.25 mg twice daily on 8/5 - Resume Tums(was previously on Tums for 30 days postop): calcium carbonate (TUMS ULTRA) 400 mg calcium (1,000 mg) chewable tablet Take 3 tablets (3,000 mg of salt total) by mouth 3 (three) times daily with meals (received first dose in the ED) Was given multiple doses of IV calcium gluconate Follow-up with endocrinologist next week  Follow-up with PCP in couple of days to check lab work          Postsurgical hypothyroidism History of papillary thyroid carcinoma s/p total thyroidectomy on 12/01/2021 Continue Synthroid at current dose Further management per endocrinology   History of peptic ulcer disease Continue PPI   Hypertension Continue home meds     COPD (chronic obstructive pulmonary disease) (HCC) Not acutely exacerbated Continue home inhalers    Discharge Diagnoses:  Principal Problem:   Hypocalcemia s/p total thyroidectomy 12/01/21 Active Problems:   Postsurgical hypothyroidism   COPD (chronic obstructive pulmonary disease) (HCC)   Hx of papillary thyroid carcinoma s/p total thyroidectomy 12/01/2021   Hypertension   History of peptic ulcer disease    Discharge Instructions  Discharge Instructions     Diet - low sodium heart healthy   Complete by: As directed    Discharge instructions   Complete by: As directed    Follow-up with your endocrinologist next week  Follow-up with PCP by Tuesday   Increase activity slowly   Complete by: As directed       Allergies as of 01/14/2022       Reactions   Tetracyclines & Related Rash        Medication List     STOP taking these  medications    amoxicillin-clavulanate 875-125 MG tablet Commonly known as: AUGMENTIN   cloNIDine 0.1 MG tablet Commonly known as: Catapres   lisinopril-hydrochlorothiazide 20-12.5 MG tablet Commonly known as: ZESTORETIC       TAKE these medications    albuterol (5 MG/ML) 0.5% nebulizer solution Commonly known as: PROVENTIL Take 2.5 mg by nebulization every 6 (six) hours as needed for wheezing or shortness of breath.   albuterol 108 (90 Base) MCG/ACT inhaler Commonly known as: VENTOLIN HFA Inhale into the lungs every 6 (six) hours as needed for wheezing or shortness of breath.   bisoprolol 5 MG tablet Commonly known as: ZEBETA Take 5 mg by mouth daily.   calcitRIOL 0.25 MCG capsule Commonly known as: ROCALTROL Take 1 capsule (0.25 mcg total) by mouth 2 (two) times daily. What changed: when to take this   calcium carbonate 500 MG chewable tablet Commonly known as: TUMS - dosed in mg elemental calcium Chew 5 tablets (1,000 mg of elemental calcium total) by mouth 3 (three) times daily with meals.   esomeprazole 20 MG capsule Commonly known as: NEXIUM Take 20 mg by mouth daily at 12 noon.   furosemide 20 MG tablet Commonly known as: LASIX Take 20 mg by mouth daily.   hydrALAZINE 50 MG tablet Commonly known as: APRESOLINE Take 50 mg by mouth 2 (two) times daily.   levothyroxine 88 MCG tablet Commonly known as: SYNTHROID Take 88 mcg by mouth daily before breakfast.   LORazepam 1 MG tablet Commonly known as: ATIVAN Take 1 tablet (1 mg total) by mouth 2 (two) times daily as needed for anxiety.   losartan 100 MG tablet Commonly known as: COZAAR Take 100 mg by mouth daily.   tiotropium 18 MCG inhalation capsule Commonly known as: SPIRIVA Place 18 mcg into inhaler and inhale daily.   Vitamin D (Ergocalciferol) 1.25 MG (50000 UNIT) Caps capsule Commonly known as: DRISDOL Take 1 capsule (50,000 Units total) by mouth every 7 (seven) days.        Follow-up  Information     Sparks, Duane Lope, MD Follow up.   Specialty: Internal Medicine Contact information: [address] [facility] [place] [address] [phone]                Allergies  Allergen Reactions   Tetracyclines & Related Rash    Consultations:    Procedures/Studies: No results found.    Subjective: No abdominal pain, nausea vomiting, tingling or any other symptoms  Discharge Exam: Vitals:   01/14/22 0730 01/14/22 1229  BP: (!) 127/93 130/60  Pulse: 62 65  Resp: 16 18  Temp: 97.9 F (36.6 C) 97.6 F (36.4 C)  SpO2: 98% 98%   Vitals:   01/14/22 0021 01/14/22 0459 01/14/22 0730 01/14/22 1229  BP: 125/64 (!) 150/75 (!) 127/93 130/60  Pulse: 62 61 62 65  Resp: 20 18 16 18   Temp: 98.1 F (36.7 C) 97.9 F (36.6 C) 97.9 F (36.6 C) 97.6 F (36.4 C)  TempSrc: Oral Oral Oral   SpO2: 94% 94% 98% 98%  Weight:      Height:        General: Pt  is alert, awake, not in acute distress Cardiovascular: RRR, S1/S2 +, no rubs, no gallops Respiratory: CTA bilaterally, no wheezing, no rhonchi Abdominal: Soft, NT, ND, bowel sounds + Extremities: no edema, no cyanosis    The results of significant diagnostics from this hospitalization (including imaging, microbiology, ancillary and laboratory) are listed below for reference.     Microbiology: No results found for this or any previous visit (from the past 240 hour(s)).   Labs: BNP (last 3 results) No results for input(s): "BNP" in the last 8760 hours. Basic Metabolic Panel: Recent Labs  Lab 01/08/22 1728 01/12/22 1625 01/13/22 0032 01/13/22 0355 01/13/22 0706 01/13/22 1212 01/14/22 0923  NA 136 136  --   --   --   --  136  K 4.2 4.3  --   --   --   --  4.0  CL 98 100  --   --   --   --  99  CO2 26 27  --   --   --   --  29  GLUCOSE 105* 112*  --   --   --   --  96  BUN 22 20  --   --   --   --  18  CREATININE 0.87 0.81  --   --   --   --  0.79  CALCIUM 6.3* 6.4* 7.3* 6.8*  6.9* 8.3* 7.8*  MG  --  2.2  --   --   --   --   --   PHOS  --  5.2*  --   --   --   --   --    Liver Function Tests: Recent Labs  Lab 01/08/22 1728 01/12/22 1625  AST 19 19  ALT 11 9  ALKPHOS 75 71  BILITOT 0.7 0.6  PROT 6.7 6.6  ALBUMIN 4.2 4.0   No results for input(s): "LIPASE", "AMYLASE" in the last 168 hours. No results for input(s): "AMMONIA" in the last 168 hours. CBC: Recent Labs  Lab 01/08/22 1728 01/12/22 1625  WBC 11.7* 12.0*  NEUTROABS 7.4 7.9*  HGB 12.9 12.6  HCT 40.1 38.8  MCV 91.6 91.1  PLT 281 274   Cardiac Enzymes: No results for input(s): "CKTOTAL", "CKMB", "CKMBINDEX", "TROPONINI" in the last 168 hours. BNP: Invalid input(s): "POCBNP" CBG: No results for input(s): "GLUCAP" in the last 168 hours. D-Dimer No results for input(s): "DDIMER" in the last 72 hours. Hgb A1c No results for input(s): "HGBA1C" in the last 72 hours. Lipid Profile No results for input(s): "CHOL", "HDL", "LDLCALC", "TRIG", "CHOLHDL", "LDLDIRECT" in the last 72 hours. Thyroid function studies Recent Labs    01/12/22 1625  TSH 2.235   Anemia work up No results for input(s): "VITAMINB12", "FOLATE", "FERRITIN", "TIBC", "IRON", "RETICCTPCT" in the last 72 hours. Urinalysis No results found for: "COLORURINE", "APPEARANCEUR", "LABSPEC", "PHURINE", "GLUCOSEU", "HGBUR", "BILIRUBINUR", "KETONESUR", "PROTEINUR", "UROBILINOGEN", "NITRITE", "LEUKOCYTESUR" Sepsis Labs Recent Labs  Lab 01/08/22 1728 01/12/22 1625  WBC 11.7* 12.0*   Microbiology No results found for this or any previous visit (from the past 240 hour(s)).   Time coordinating discharge: Over 30 minutes  SIGNED:   Lynn Ito, MD  [facility] 01/14/2022, 12:49 PM Pager   If 7PM-7AM, please contact night-coverage [email or web address] Password [email or web address]

## 2022-01-15 LAB — PARATHYROID HORMONE, INTACT (NO CA): PTH: 23 pg/mL (ref 15–65)

## 2022-01-15 NOTE — ED Provider Notes (Signed)
[facility] Provider Note   Event Date/Time   First MD Initiated Contact with Patient 01/12/22 1702     (approximate) History  abnormal labs  HPI Gabriela [name] is a 76 y.o. female with a history of recent thyroidectomy who presents after being told that her calcium is abnormally low.  Patient states that status post thyroidectomy on 12/01/2021, she was on calcitriol for 2 weeks however after stopping it she states that her calcium has been abnormally low however has not started any supplements.  Patient has not had any dietary changes as well.  Patient denies any other complaints ROS: Patient currently denies any vision changes, tinnitus, difficulty speaking, facial droop, sore throat, chest pain, shortness of breath, abdominal pain, nausea/vomiting/diarrhea, dysuria, or weakness/numbness/paresthesias in any extremity   Physical Exam  Triage Vital Signs: ED Triage Vitals  Enc Vitals Group     BP 01/12/22 1622 134/64     Pulse Rate 01/12/22 1622 76     Resp 01/12/22 1622 16     Temp 01/12/22 1622 98.2 F (36.8 C)     Temp Source 01/12/22 1622 Oral     SpO2 01/12/22 1622 97 %     Weight 01/12/22 1618 132 lb (59.9 kg)     Height 01/12/22 1618 5\' 4"  (1.626 m)     Head Circumference --      Peak Flow --      Pain Score 01/12/22 1618 0     Pain Loc --      Pain Edu? --      Excl. in GC? --    Most recent vital signs: Vitals:   01/14/22 0730 01/14/22 1229  BP: (!) 127/93 130/60  Pulse: 62 65  Resp: 16 18  Temp: 97.9 F (36.6 C) 97.6 F (36.4 C)  SpO2: 98% 98%   General: Awake, oriented x4. CV:  Good peripheral perfusion.  Resp:  Normal effort.  Abd:  No distention.  Other:  Elderly Caucasian female laying in bed in no acute distress ED Results / Procedures / Treatments  Labs (all labs ordered are listed, but only abnormal results are displayed) Labs Reviewed  BASIC METABOLIC PANEL - Abnormal; Notable for the following components:      Result  Value   Glucose, Bld 112 (*)    Calcium 6.4 (*)    All other components within normal limits  CBC WITH DIFFERENTIAL/PLATELET - Abnormal; Notable for the following components:   WBC 12.0 (*)    Neutro Abs 7.9 (*)    All other components within normal limits  PHOSPHORUS - Abnormal; Notable for the following components:   Phosphorus 5.2 (*)    All other components within normal limits  T4, FREE - Abnormal; Notable for the following components:   Free T4 1.31 (*)    All other components within normal limits  CALCIUM, IONIZED - Abnormal; Notable for the following components:   Calcium, Ionized, Serum 3.5 (*)    All other components within normal limits  CALCIUM - Abnormal; Notable for the following components:   Calcium 7.3 (*)    All other components within normal limits  CALCIUM - Abnormal; Notable for the following components:   Calcium 6.8 (*)    All other components within normal limits  CALCIUM - Abnormal; Notable for the following components:   Calcium 6.9 (*)    All other components within normal limits  CALCIUM - Abnormal; Notable for the following components:   Calcium 8.3 (*)  All other components within normal limits  BASIC METABOLIC PANEL - Abnormal; Notable for the following components:   Calcium 7.8 (*)    All other components within normal limits  PARATHYROID HORMONE, INTACT (NO CA)  MAGNESIUM  HEPATIC FUNCTION PANEL  TSH   EKG ED ECG REPORT I, Merwyn Katos, the attending physician, personally viewed and interpreted this ECG. Date: 01/15/2022 EKG Time: 1826 Rate: 70 Rhythm: normal sinus rhythm QRS Axis: normal Intervals: normal ST/T Wave abnormalities: normal Narrative Interpretation: no evidence of acute ischemia PROCEDURES: Critical Care performed: No .1-3 Lead EKG Interpretation  Performed by: Merwyn Katos, MD Authorized by: Merwyn Katos, MD     Interpretation: normal     ECG rate:  66   ECG rate assessment: normal     Rhythm: sinus  rhythm     Ectopy: none     Conduction: normal    MEDICATIONS ORDERED IN ED: Medications  calcium gluconate 1 g/ 50 mL sodium chloride IVPB (0 mg Intravenous Stopped 01/12/22 1946)  calcium carbonate (TUMS - dosed in mg elemental calcium) chewable tablet 800 mg of elemental calcium (800 mg of elemental calcium Oral Given 01/12/22 2000)  calcitRIOL (ROCALTROL) capsule 0.5 mcg (0.5 mcg Oral Given 01/12/22 2026)  calcium gluconate 1 g/ 50 mL sodium chloride IVPB (0 mg Intravenous Stopped 01/13/22 0001)  calcium gluconate 1 g/ 50 mL sodium chloride IVPB (0 mg Intravenous Stopped 01/13/22 1058)  calcium gluconate 1 g/ 50 mL sodium chloride IVPB (0 mg Intravenous Stopped 01/13/22 1548)  calcium gluconate 1 g/ 50 mL sodium chloride IVPB (0 mg Intravenous Stopped 01/14/22 1337)    Followed by  calcium gluconate 1 g/ 50 mL sodium chloride IVPB (0 mg Intravenous Stopped 01/14/22 1507)   IMPRESSION / MDM / ASSESSMENT AND PLAN / ED COURSE  I reviewed the triage vital signs and the nursing notes.                             Differential diagnosis includes, but is not limited to, hypocalcemia, hypothyroidism, thyroid storm The patient is on the cardiac monitor to evaluate for evidence of arrhythmia and/or significant heart rate changes. Patient's presentation is most consistent with acute presentation with potential threat to life or bodily function. Patient 76 year old female who presents at her doctor's request for a low calcium level 6.3.  Patient's EKG shows no signs of acute changes.  Patient does not complain of generalized weakness, muscle aches, altered mental status, increased thirst, or decreased urination as signs of significant hypocalcemia.  Patient was given calcium gluconate while in emergency department and offered admission however patient states that she wants to try calcitriol and vitamin D supplementation at home as well as follow-up with her endocrinology and oncology physician.  Given no red flag  symptomatology, patient is stable for discharge at this time.  The patient has been reexamined and is ready to be discharged.  All diagnostic results have been reviewed and discussed with the patient/family.  Care plan has been outlined and the patient/family understands all current diagnoses, results, and treatment plans.  There are no new complaints, changes, or physical findings at this time.  All questions have been addressed and answered.  All medications, if any, that were given while in the emergency department or any that are being prescribed have been reviewed with the patient/family.  All side effects and adverse reactions have been explained.  Patient was instructed to, and  agrees to follow-up with their primary care physician as well as return to the emergency department if any new or worsening symptoms develop.  Dispo: Discharge home with PCP follow-up   FINAL CLINICAL IMPRESSION(S) / ED DIAGNOSES   Final diagnoses:  Hypocalcemia   Rx / DC Orders   ED Discharge Orders          Ordered    Increase activity slowly        01/14/22 1241    Diet - low sodium heart healthy        01/14/22 1241    Discharge instructions       Comments: Follow-up with your endocrinologist next week Follow-up with PCP by Tuesday   01/14/22 1241    calcitRIOL (ROCALTROL) 0.25 MCG capsule  2 times daily        01/14/22 1247    calcium carbonate (TUMS - DOSED IN MG ELEMENTAL CALCIUM) 500 MG chewable tablet  3 times daily with meals        01/14/22 1247           Note:  This document was prepared using Dragon voice recognition software and may include unintentional dictation errors.   Naaman Plummer, MD 01/15/22 [phone]

## 2022-02-20 NOTE — ED Provider Notes (Signed)
[facility] Provider Note    Event Date/Time   First MD Initiated Contact with Patient 01/12/22 1702     (approximate) History  abnormal labs   HPI Gabriela [name] is a 76 y.o. female with a history of recent thyroidectomy who presents after being told that her calcium is abnormally low.  Patient states that status post thyroidectomy on 12/01/2021, she was on calcitriol for 2 weeks however after stopping it she states that her calcium has been abnormally low however has not started any supplements.  Patient has not had any dietary changes as well.  Patient denies any other complaints ROS: Patient currently denies any vision changes, tinnitus, difficulty speaking, facial droop, sore throat, chest pain, shortness of breath, abdominal pain, nausea/vomiting/diarrhea, dysuria, or weakness/numbness/paresthesias in any extremity Physical Exam  Triage Vital Signs:     ED Triage Vitals  Enc Vitals Group     BP 01/12/22 1622 134/64     Pulse Rate 01/12/22 1622 76     Resp 01/12/22 1622 16     Temp 01/12/22 1622 98.2 F (36.8 C)     Temp Source 01/12/22 1622 Oral     SpO2 01/12/22 1622 97 %     Weight 01/12/22 1618 132 lb (59.9 kg)     Height 01/12/22 1618 5\' 4"  (1.626 m)     Head Circumference --       Peak Flow --       Pain Score 01/12/22 1618 0     Pain Loc --       Pain Edu? --       Excl. in GC? --      Most recent vital signs:     Vitals:    01/14/22 0730 01/14/22 1229  BP: (!) 127/93 130/60  Pulse: 62 65  Resp: 16 18  Temp: 97.9 F (36.6 C) 97.6 F (36.4 C)  SpO2: 98% 98%    General:          Awake, oriented x4. CV:                  Good peripheral perfusion.  Resp:               Normal effort.  Abd:                 No distention.  Other:              Elderly Caucasian female laying in bed in no acute distress ED Results / Procedures / Treatments  Labs (all labs ordered are listed, but only abnormal results are displayed)      Labs Reviewed   BASIC METABOLIC PANEL - Abnormal; Notable for the following components:      Result Value     Glucose, Bld 112 (*)      Calcium 6.4 (*)      All other components within normal limits  CBC WITH DIFFERENTIAL/PLATELET - Abnormal; Notable for the following components:    WBC 12.0 (*)      Neutro Abs 7.9 (*)      All other components within normal limits  PHOSPHORUS - Abnormal; Notable for the following components:    Phosphorus 5.2 (*)      All other components within normal limits  T4, FREE - Abnormal; Notable for the following components:    Free T4 1.31 (*)      All other components within normal limits  CALCIUM, IONIZED - Abnormal;  Notable for the following components:    Calcium, Ionized, Serum 3.5 (*)      All other components within normal limits  CALCIUM - Abnormal; Notable for the following components:    Calcium 7.3 (*)      All other components within normal limits  CALCIUM - Abnormal; Notable for the following components:    Calcium 6.8 (*)      All other components within normal limits  CALCIUM - Abnormal; Notable for the following components:    Calcium 6.9 (*)      All other components within normal limits  CALCIUM - Abnormal; Notable for the following components:    Calcium 8.3 (*)      All other components within normal limits  BASIC METABOLIC PANEL - Abnormal; Notable for the following components:    Calcium 7.8 (*)      All other components within normal limits  PARATHYROID HORMONE, INTACT (NO CA)  MAGNESIUM  HEPATIC FUNCTION PANEL  TSH    EKG ED ECG REPORT I, Merwyn Katos, the attending physician, personally viewed and interpreted this ECG. Date: 01/15/2022 EKG Time: 1826 Rate: 70 Rhythm: normal sinus rhythm QRS Axis: normal Intervals: normal ST/T Wave abnormalities: normal Narrative Interpretation: no evidence of acute ischemia PROCEDURES: Critical Care performed: No .1-3 Lead EKG Interpretation   Performed by: Merwyn Katos, MD Authorized  by: Merwyn Katos, MD     Interpretation: normal     ECG rate:  66   ECG rate assessment: normal     Rhythm: sinus rhythm     Ectopy: none     Conduction: normal     MEDICATIONS ORDERED IN ED: Medications  calcium gluconate 1 g/ 50 mL sodium chloride IVPB (0 mg Intravenous Stopped 01/12/22 1946)  calcium carbonate (TUMS - dosed in mg elemental calcium) chewable tablet 800 mg of elemental calcium (800 mg of elemental calcium Oral Given 01/12/22 2000)  calcitRIOL (ROCALTROL) capsule 0.5 mcg (0.5 mcg Oral Given 01/12/22 2026)  calcium gluconate 1 g/ 50 mL sodium chloride IVPB (0 mg Intravenous Stopped 01/13/22 0001)  calcium gluconate 1 g/ 50 mL sodium chloride IVPB (0 mg Intravenous Stopped 01/13/22 1058)  calcium gluconate 1 g/ 50 mL sodium chloride IVPB (0 mg Intravenous Stopped 01/13/22 1548)  calcium gluconate 1 g/ 50 mL sodium chloride IVPB (0 mg Intravenous Stopped 01/14/22 1337)    Followed by  calcium gluconate 1 g/ 50 mL sodium chloride IVPB (0 mg Intravenous Stopped 01/14/22 1507)    IMPRESSION / MDM / ASSESSMENT AND PLAN / ED COURSE  I reviewed the triage vital signs and the nursing notes.                             Differential diagnosis includes, but is not limited to, hypocalcemia, hypothyroidism, thyroid storm The patient is on the cardiac monitor to evaluate for evidence of arrhythmia and/or significant heart rate changes. Patient's presentation is most consistent with acute presentation with potential threat to life or bodily function. Patient 76 year old female who presents at her doctor's request for a low calcium level 6.3.  Patient's EKG shows no signs of acute changes.  Patient does not complain of generalized weakness, muscle aches, altered mental status, increased thirst, or decreased urination as signs of significant hypocalcemia.  Patient was given calcium gluconate while in emergency department and offered admission however patient states that she wants to try calcitriol and  vitamin D supplementation  at home as well as follow-up with her endocrinology and oncology physician.  Given no red flag symptomatology, patient is stable for discharge at this time.  The patient has been reexamined and is ready to be discharged.  All diagnostic results have been reviewed and discussed with the patient/family.  Care plan has been outlined and the patient/family understands all current diagnoses, results, and treatment plans.  There are no new complaints, changes, or physical findings at this time.  All questions have been addressed and answered.  All medications, if any, that were given while in the emergency department or any that are being prescribed have been reviewed with the patient/family.  All side effects and adverse reactions have been explained.  Patient was instructed to, and agrees to follow-up with their primary care physician as well as return to the emergency department if any new or worsening symptoms develop.   Dispo: Discharge home with PCP follow-up FINAL CLINICAL IMPRESSION(S) / ED DIAGNOSES    Final diagnoses:  Hypocalcemia    Rx / DC Orders    ED Discharge Orders             Ordered      Increase activity slowly        01/14/22 1241      Diet - low sodium heart healthy        01/14/22 1241      Discharge instructions       Comments: Follow-up with your endocrinologist next week Follow-up with PCP by Tuesday   01/14/22 1241      calcitRIOL (ROCALTROL) 0.25 MCG capsule  2 times daily        01/14/22 1247      calcium carbonate (TUMS - DOSED IN MG ELEMENTAL CALCIUM) 500 MG chewable tablet  3 times daily with meals        01/14/22 1247      Note:  This document was prepared using Dragon voice recognition software and may include unintentional dictation errors.   Merwyn Katos, MD 02/20/22 [phone]

## 2022-05-08 ENCOUNTER — Other Ambulatory Visit: Payer: Self-pay | Admitting: Internal Medicine

## 2022-05-08 DIAGNOSIS — Z1231 Encounter for screening mammogram for malignant neoplasm of breast: Secondary | ICD-10-CM

## 2022-05-15 ENCOUNTER — Ambulatory Visit
Admission: RE | Admit: 2022-05-15 | Discharge: 2022-05-15 | Disposition: A | Discharge status: Home / Self Care | Payer: Medicare Other | Source: Ambulatory Visit | Attending: Specialist | Admitting: Specialist

## 2022-05-15 DIAGNOSIS — R911 Solitary pulmonary nodule: Secondary | ICD-10-CM | POA: Diagnosis present

## 2022-05-15 DIAGNOSIS — E041 Nontoxic single thyroid nodule: Secondary | ICD-10-CM | POA: Insufficient documentation

## 2022-05-23 ENCOUNTER — Ambulatory Visit
Admission: RE | Admit: 2022-05-23 | Discharge: 2022-05-23 | Disposition: A | Discharge status: Home / Self Care | Payer: Medicare Other | Source: Ambulatory Visit | Attending: Internal Medicine | Admitting: Internal Medicine

## 2022-05-23 DIAGNOSIS — Z1231 Encounter for screening mammogram for malignant neoplasm of breast: Secondary | ICD-10-CM | POA: Insufficient documentation

## 2022-08-27 IMAGING — CT NM PET TUM IMG INITIAL (PI) SKULL BASE T - THIGH
9 series · 24 of 25 positions shown · non-contrast
Comparison: CT 05/30/2021

CLINICAL DATA: Initial treatment strategy for lung nodules insert.

EXAM:
NUCLEAR MEDICINE PET SKULL BASE TO THIGH
TECHNIQUE: 7.7 mCi F-18 FDG was injected intravenously. Full-ring PET imaging
was performed from the skull base to thigh after the radiotracer. CT
data was obtained and used for attenuation correction and anatomic
localization.
Fasting blood glucose: 102 mg/dl

[Series 3: ct wb 5.0 b30f · axial · 5.0mm · 0.98mm/px · z∈[-1359,-492]mm · 3 of 286 slices shown]
[im 1/286]
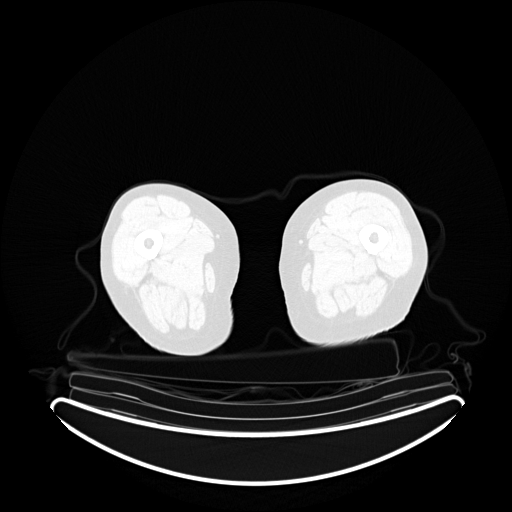
[im 143/286]
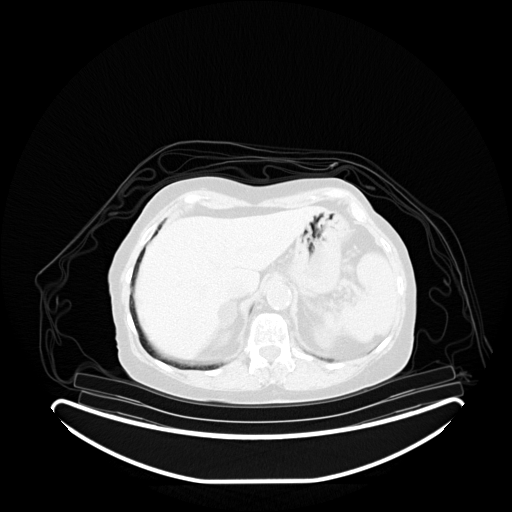
[im 286/286  brain]
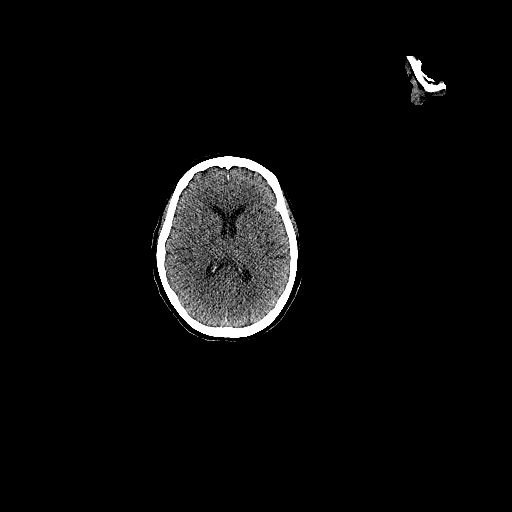

[Series 5: pet wb uncorrected (nac) · axial · 5.0mm · 4.07mm/px · z∈[-1359,-492]mm · 3 of 287 slices shown]
[im 1/287]
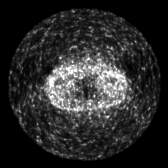
[im 144/287]
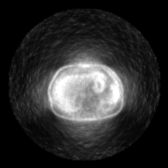
[im 287/287]
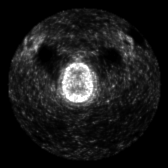

[Series 6: pet wb (ac) · axial · 5.0mm · 3.39mm/px · z∈[-1359,-492]mm · 4 of 290 slices shown]
[im 1/290]
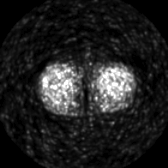
[im 97/290]
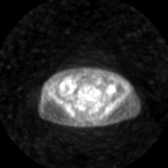
[im 193/290]
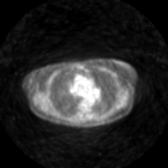
[im 290/290]
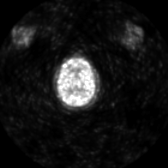

[Series 603: pet axial fused · 3 of 286 slices shown]
[im 1/286]
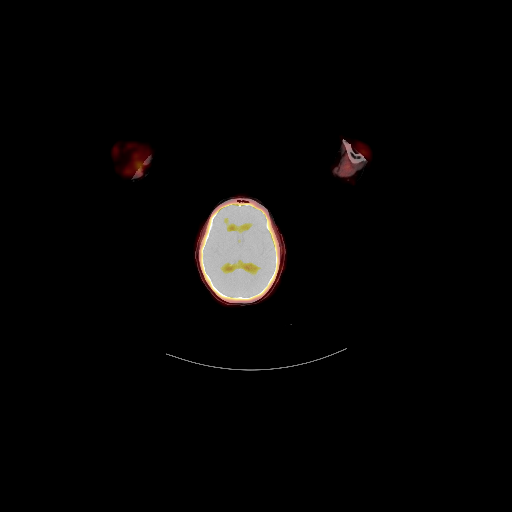
[im 96/286]
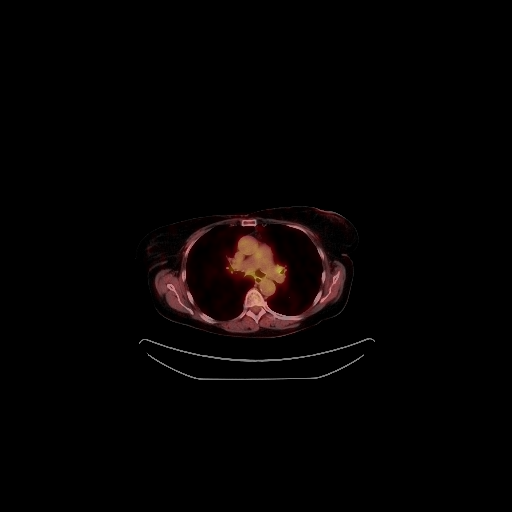
[im 286/286]
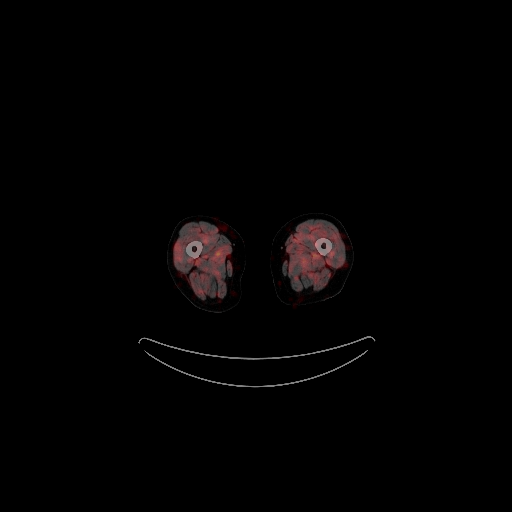

[Series 604: pet coronal fused · 1 of 82 slices shown]
[im 1/82]
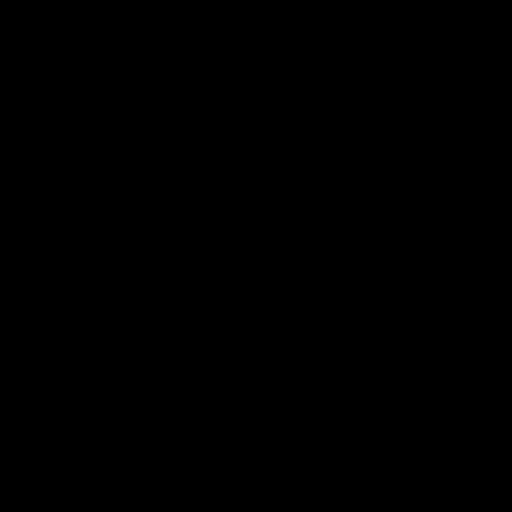

[Series 605: pet sagittal fused · 2 of 148 slices shown]
[im 1/148]
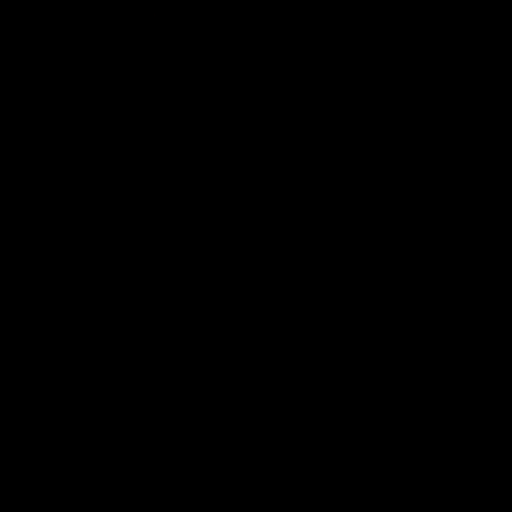
[im 148/148]
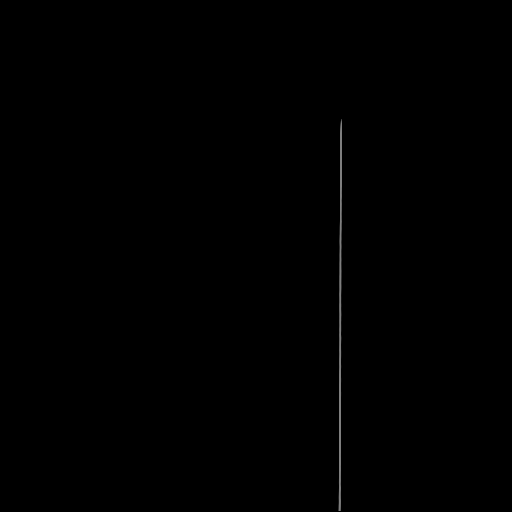

[Series 606: pet axial · 4 of 288 slices shown]
[im 1/288]
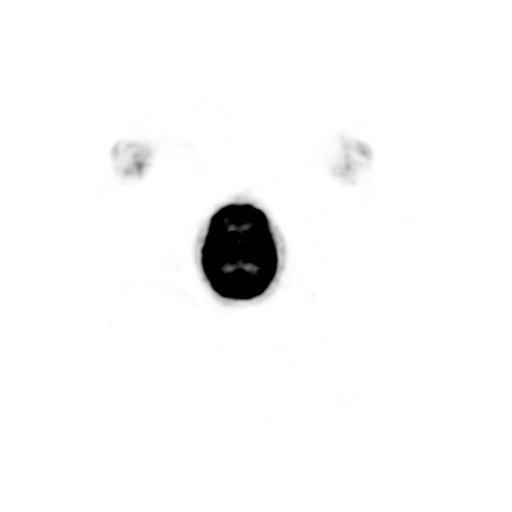
[im 96/288]
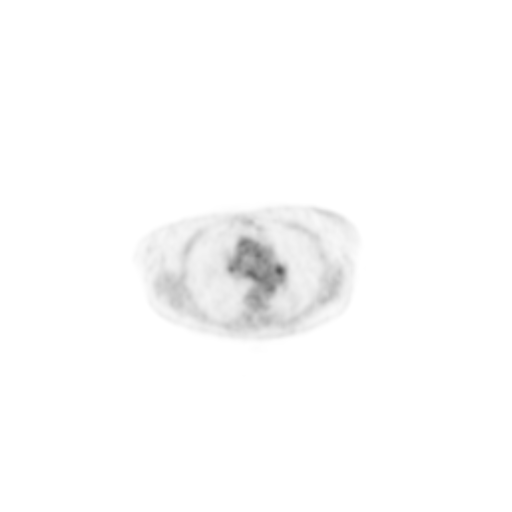
[im 192/288]
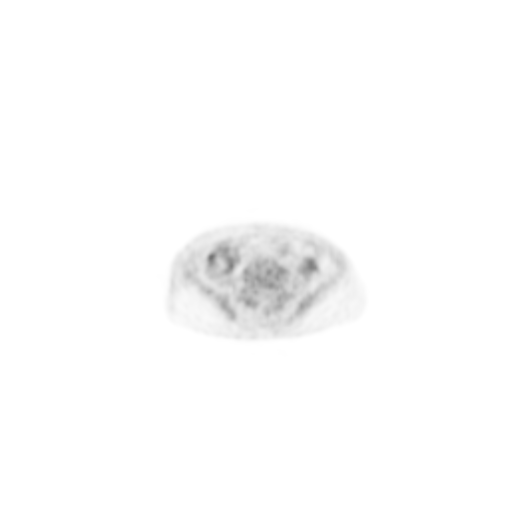
[im 288/288]
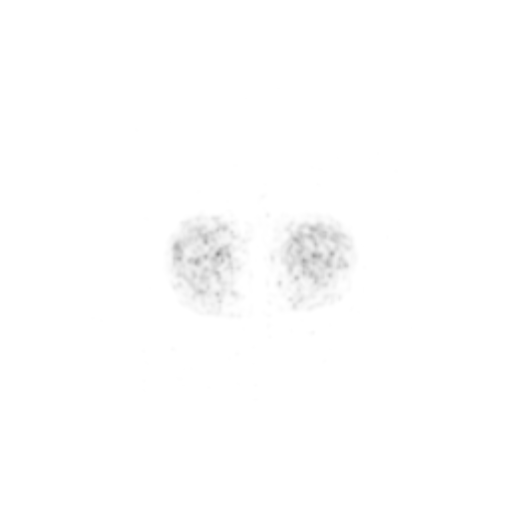

[Series 607: pet coronal · 2 of 124 slices shown]
[im 1/124]
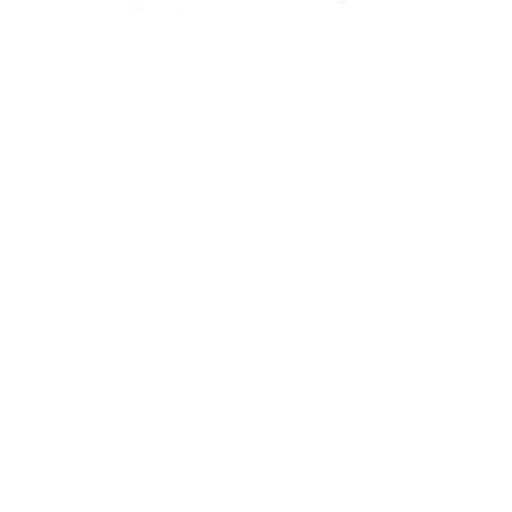
[im 124/124]
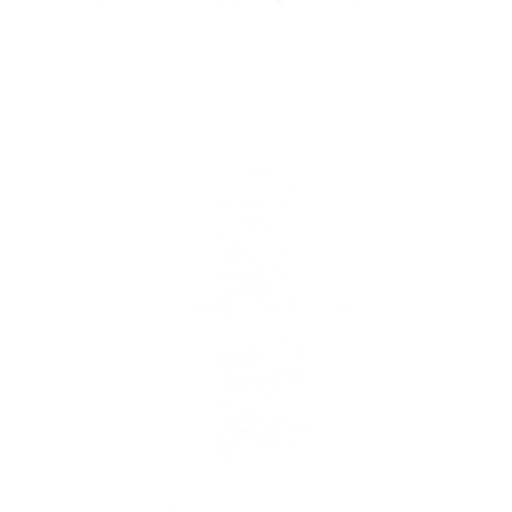

[Series 608: pet sagittal · 2 of 155 slices shown]
[im 1/155]
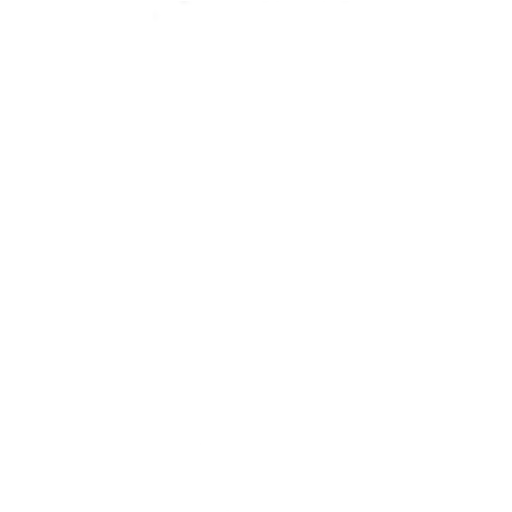
[im 155/155]
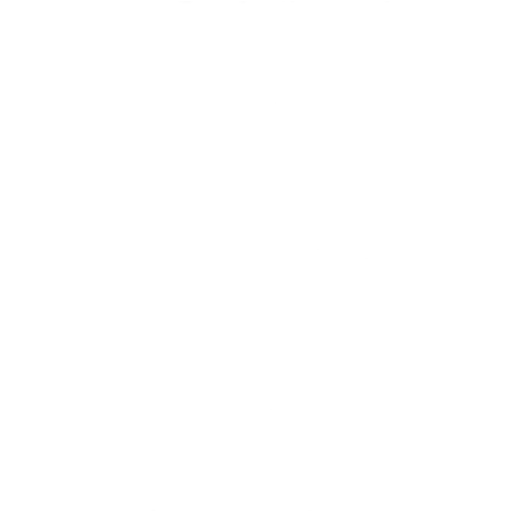

[24 of 25 positions shown; findings below may reference images not displayed]

FINDINGS: Mediastinal blood pool activity: SUV max

Liver activity: SUV max

NECK: focal hypermetabolic activity within the RIGHT lobe of thyroid
gland corresponds to a 15 mm nodule.

Incidental CT findings: none

CHEST: The nodule of concern in the medial RIGHT lung base has
resolved in the interval.

There several additional pulmonary nodules which are 5 mm or less in
not accurately characterized FDG PET imaging. The sub solid nodule
in the RIGHT lower lobe is poorly identified on the nondiagnostic CT
scan.

No hypermetabolic mediastinal lymph nodes.

Incidental CT findings: none

ABDOMEN/PELVIS: No abnormal hypermetabolic activity within the
liver, pancreas, adrenal glands, or spleen. No hypermetabolic lymph
nodes in the abdomen or pelvis.

Incidental CT findings: Diverticula of the LEFT colon.
Atherosclerotic calcification of the aorta. Benign adenoma of the
RIGHT adrenal gland.

SKELETON: No focal hypermetabolic activity to suggest skeletal
metastasis.

Incidental CT findings: none
IMPRESSION: 1. Interval resolution of the nodule of concern in the medial RIGHT
lower lobe.
2. No hypermetabolic pulmonary nodules in LEFT or RIGHT lung to
suggest bronchogenic carcinoma. The nodule described on comparison
CT are either too small to characterize by PET imaging or to
low-density. Recommend follow-up as recommended on CT 05/30/2021.
[DATE]. A 1.5 cm incidental right thyroid nodule with increased metabolic
uptake. Recommend thyroid US and biopsy. Reference: [HOSPITAL]. [DATE]): 143-50.

These results will be called to the ordering clinician or
representative by the Radiologist Assistant, and communication
documented in the PACS or [REDACTED].

## 2022-11-06 ENCOUNTER — Other Ambulatory Visit: Payer: Self-pay | Admitting: Specialist

## 2022-11-06 DIAGNOSIS — R911 Solitary pulmonary nodule: Secondary | ICD-10-CM

## 2022-11-12 ENCOUNTER — Ambulatory Visit: Payer: Medicare Other

## 2022-11-14 ENCOUNTER — Ambulatory Visit
Admission: RE | Admit: 2022-11-14 | Discharge: 2022-11-14 | Disposition: A | Discharge status: Home / Self Care | Payer: Medicare Other | Source: Ambulatory Visit | Attending: Specialist | Admitting: Specialist

## 2022-11-14 DIAGNOSIS — R911 Solitary pulmonary nodule: Secondary | ICD-10-CM | POA: Diagnosis present

## 2023-04-09 ENCOUNTER — Other Ambulatory Visit: Payer: Self-pay | Admitting: Internal Medicine

## 2023-04-09 DIAGNOSIS — Z1231 Encounter for screening mammogram for malignant neoplasm of breast: Secondary | ICD-10-CM

## 2023-05-08 ENCOUNTER — Other Ambulatory Visit: Payer: Self-pay | Admitting: Specialist

## 2023-05-08 DIAGNOSIS — R918 Other nonspecific abnormal finding of lung field: Secondary | ICD-10-CM

## 2023-05-08 DIAGNOSIS — J4489 Other specified chronic obstructive pulmonary disease: Secondary | ICD-10-CM

## 2023-05-22 ENCOUNTER — Ambulatory Visit
Admission: RE | Admit: 2023-05-22 | Discharge: 2023-05-22 | Disposition: A | Discharge status: Home / Self Care | Payer: Medicare Other | Source: Ambulatory Visit | Attending: Specialist | Admitting: Specialist

## 2023-05-22 DIAGNOSIS — R918 Other nonspecific abnormal finding of lung field: Secondary | ICD-10-CM | POA: Insufficient documentation

## 2023-05-22 DIAGNOSIS — J4489 Other specified chronic obstructive pulmonary disease: Secondary | ICD-10-CM | POA: Diagnosis present

## 2023-06-06 ENCOUNTER — Ambulatory Visit
Admission: RE | Admit: 2023-06-06 | Discharge: 2023-06-06 | Disposition: A | Discharge status: Home / Self Care | Payer: Medicare Other | Source: Ambulatory Visit | Attending: Internal Medicine | Admitting: Internal Medicine

## 2023-06-06 DIAGNOSIS — Z1231 Encounter for screening mammogram for malignant neoplasm of breast: Secondary | ICD-10-CM | POA: Diagnosis present

## 2023-12-27 ENCOUNTER — Other Ambulatory Visit: Payer: Self-pay | Admitting: Specialist

## 2023-12-27 DIAGNOSIS — J4489 Other specified chronic obstructive pulmonary disease: Secondary | ICD-10-CM

## 2023-12-27 DIAGNOSIS — R918 Other nonspecific abnormal finding of lung field: Secondary | ICD-10-CM

## 2024-01-01 ENCOUNTER — Ambulatory Visit

## 2024-01-02 ENCOUNTER — Ambulatory Visit
Admission: RE | Admit: 2024-01-02 | Discharge: 2024-01-02 | Disposition: A | Discharge status: Home / Self Care | Source: Ambulatory Visit | Attending: Specialist | Admitting: Specialist

## 2024-01-02 DIAGNOSIS — R918 Other nonspecific abnormal finding of lung field: Secondary | ICD-10-CM | POA: Diagnosis present

## 2024-01-02 DIAGNOSIS — J4489 Other specified chronic obstructive pulmonary disease: Secondary | ICD-10-CM | POA: Diagnosis present

## 2024-01-07 ENCOUNTER — Other Ambulatory Visit: Payer: Self-pay | Admitting: Specialist

## 2024-01-07 DIAGNOSIS — R918 Other nonspecific abnormal finding of lung field: Secondary | ICD-10-CM

## 2024-01-07 DIAGNOSIS — R569 Unspecified convulsions: Secondary | ICD-10-CM

## 2024-01-13 ENCOUNTER — Ambulatory Visit
Admission: RE | Admit: 2024-01-13 | Discharge: 2024-01-13 | Disposition: A | Discharge status: Home / Self Care | Source: Ambulatory Visit | Attending: Specialist | Admitting: Specialist

## 2024-01-13 DIAGNOSIS — R569 Unspecified convulsions: Secondary | ICD-10-CM | POA: Diagnosis present

## 2024-01-13 DIAGNOSIS — R918 Other nonspecific abnormal finding of lung field: Secondary | ICD-10-CM | POA: Diagnosis present

## 2024-01-13 MED ORDER — GADOBUTROL 1 MMOL/ML IV SOLN
6.0000 mL | Freq: Once | INTRAVENOUS | Status: AC | PRN
  Administered 2024-01-13: 6 mL via INTRAVENOUS

## 2024-05-26 ENCOUNTER — Other Ambulatory Visit: Payer: Self-pay | Admitting: Specialist

## 2024-05-26 DIAGNOSIS — R918 Other nonspecific abnormal finding of lung field: Secondary | ICD-10-CM

## 2024-06-16 ENCOUNTER — Ambulatory Visit
Admission: RE | Admit: 2024-06-16 | Discharge: 2024-06-16 | Disposition: A | Discharge status: Home / Self Care | Source: Ambulatory Visit | Attending: Specialist | Admitting: Specialist

## 2024-06-16 DIAGNOSIS — R918 Other nonspecific abnormal finding of lung field: Secondary | ICD-10-CM | POA: Diagnosis present
